# Patient Record
Sex: Female | Born: 1985 | Race: Black or African American | Hispanic: No | Marital: Single | Smoking: Never smoker
Health system: Southern US, Community
[De-identification: ages and names within clinical notes are randomized; demographics above are authoritative.]

## PROBLEM LIST (undated history)

## (undated) DIAGNOSIS — F410 Panic disorder [episodic paroxysmal anxiety] without agoraphobia: Secondary | ICD-10-CM

## (undated) DIAGNOSIS — R51 Headache: Secondary | ICD-10-CM

## (undated) DIAGNOSIS — R519 Headache, unspecified: Secondary | ICD-10-CM

## (undated) DIAGNOSIS — G40B09 Juvenile myoclonic epilepsy, not intractable, without status epilepticus: Secondary | ICD-10-CM

## (undated) DIAGNOSIS — F419 Anxiety disorder, unspecified: Secondary | ICD-10-CM

## (undated) DIAGNOSIS — R569 Unspecified convulsions: Secondary | ICD-10-CM

## (undated) HISTORY — DX: Headache, unspecified: R51.9

## (undated) HISTORY — DX: Panic disorder (episodic paroxysmal anxiety): F41.0

## (undated) HISTORY — PX: TUBAL LIGATION: SHX77

## (undated) HISTORY — PX: LEG SURGERY: SHX1003

## (undated) HISTORY — DX: Juvenile myoclonic epilepsy, not intractable, without status epilepticus: G40.B09

## (undated) HISTORY — DX: Anxiety disorder, unspecified: F41.9

## (undated) HISTORY — DX: Headache: R51

---
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
## 2007-01-11 ENCOUNTER — Emergency Department (HOSPITAL_COMMUNITY): Admission: EM | Admit: 2007-01-11 | Discharge: 2007-01-11 | Payer: Self-pay | Admitting: Emergency Medicine

## 2007-02-03 ENCOUNTER — Emergency Department (HOSPITAL_COMMUNITY): Admission: EM | Admit: 2007-02-03 | Discharge: 2007-02-03 | Payer: Self-pay | Admitting: Emergency Medicine

## 2007-02-13 ENCOUNTER — Emergency Department (HOSPITAL_COMMUNITY): Admission: EM | Admit: 2007-02-13 | Discharge: 2007-02-13 | Payer: Self-pay | Admitting: Emergency Medicine

## 2007-03-24 ENCOUNTER — Inpatient Hospital Stay (HOSPITAL_COMMUNITY): Admission: AD | Admit: 2007-03-24 | Discharge: 2007-03-25 | Payer: Self-pay | Admitting: Obstetrics & Gynecology

## 2007-03-24 ENCOUNTER — Emergency Department (HOSPITAL_COMMUNITY): Admission: EM | Admit: 2007-03-24 | Discharge: 2007-03-24 | Payer: Self-pay | Admitting: Emergency Medicine

## 2007-03-31 ENCOUNTER — Emergency Department (HOSPITAL_COMMUNITY): Admission: EM | Admit: 2007-03-31 | Discharge: 2007-03-31 | Payer: Self-pay | Admitting: Emergency Medicine

## 2008-01-15 ENCOUNTER — Emergency Department (HOSPITAL_COMMUNITY): Admission: EM | Admit: 2008-01-15 | Discharge: 2008-01-16 | Payer: Self-pay | Admitting: Emergency Medicine

## 2008-01-21 ENCOUNTER — Emergency Department (HOSPITAL_COMMUNITY): Admission: EM | Admit: 2008-01-21 | Discharge: 2008-01-21 | Payer: Self-pay | Admitting: Emergency Medicine

## 2008-02-29 ENCOUNTER — Inpatient Hospital Stay (HOSPITAL_COMMUNITY): Admission: AD | Admit: 2008-02-29 | Discharge: 2008-02-29 | Payer: Self-pay | Admitting: Family Medicine

## 2008-04-21 ENCOUNTER — Emergency Department (HOSPITAL_COMMUNITY): Admission: EM | Admit: 2008-04-21 | Discharge: 2008-04-22 | Payer: Self-pay | Admitting: Emergency Medicine

## 2008-04-29 ENCOUNTER — Emergency Department (HOSPITAL_COMMUNITY): Admission: EM | Admit: 2008-04-29 | Discharge: 2008-04-30 | Payer: Self-pay | Admitting: Emergency Medicine

## 2008-06-29 ENCOUNTER — Inpatient Hospital Stay (HOSPITAL_COMMUNITY): Admission: AD | Admit: 2008-06-29 | Discharge: 2008-06-29 | Payer: Self-pay | Admitting: Obstetrics

## 2008-07-13 ENCOUNTER — Emergency Department (HOSPITAL_COMMUNITY): Admission: EM | Admit: 2008-07-13 | Discharge: 2008-07-14 | Payer: Self-pay | Admitting: Emergency Medicine

## 2008-07-19 ENCOUNTER — Inpatient Hospital Stay (HOSPITAL_COMMUNITY): Admission: AD | Admit: 2008-07-19 | Discharge: 2008-07-19 | Payer: Self-pay | Admitting: Obstetrics

## 2008-07-29 ENCOUNTER — Inpatient Hospital Stay (HOSPITAL_COMMUNITY): Admission: AD | Admit: 2008-07-29 | Discharge: 2008-07-29 | Payer: Self-pay | Admitting: Obstetrics

## 2008-08-03 ENCOUNTER — Emergency Department (HOSPITAL_COMMUNITY): Admission: EM | Admit: 2008-08-03 | Discharge: 2008-08-03 | Payer: Self-pay | Admitting: Emergency Medicine

## 2008-08-22 ENCOUNTER — Inpatient Hospital Stay (HOSPITAL_COMMUNITY): Admission: AD | Admit: 2008-08-22 | Discharge: 2008-08-22 | Payer: Self-pay | Admitting: Obstetrics

## 2008-08-22 ENCOUNTER — Ambulatory Visit: Payer: Self-pay | Admitting: Physician Assistant

## 2008-09-01 ENCOUNTER — Ambulatory Visit: Payer: Self-pay | Admitting: Advanced Practice Midwife

## 2008-09-01 ENCOUNTER — Inpatient Hospital Stay (HOSPITAL_COMMUNITY): Admission: AD | Admit: 2008-09-01 | Discharge: 2008-09-02 | Payer: Self-pay | Admitting: Obstetrics

## 2008-10-10 ENCOUNTER — Inpatient Hospital Stay (HOSPITAL_COMMUNITY): Admission: AD | Admit: 2008-10-10 | Discharge: 2008-10-10 | Payer: Self-pay | Admitting: Obstetrics

## 2008-10-20 ENCOUNTER — Inpatient Hospital Stay (HOSPITAL_COMMUNITY): Admission: RE | Admit: 2008-10-20 | Discharge: 2008-10-22 | Payer: Self-pay | Admitting: Obstetrics

## 2008-11-03 ENCOUNTER — Emergency Department (HOSPITAL_COMMUNITY): Admission: EM | Admit: 2008-11-03 | Discharge: 2008-11-04 | Payer: Self-pay | Admitting: Emergency Medicine

## 2008-11-17 ENCOUNTER — Emergency Department (HOSPITAL_COMMUNITY): Admission: EM | Admit: 2008-11-17 | Discharge: 2008-11-17 | Payer: Self-pay | Admitting: Emergency Medicine

## 2009-01-18 ENCOUNTER — Emergency Department (HOSPITAL_COMMUNITY): Admission: EM | Admit: 2009-01-18 | Discharge: 2009-01-18 | Payer: Self-pay | Admitting: Family Medicine

## 2009-07-04 ENCOUNTER — Emergency Department (HOSPITAL_COMMUNITY): Admission: EM | Admit: 2009-07-04 | Discharge: 2009-07-04 | Payer: Self-pay | Admitting: Emergency Medicine

## 2009-09-30 ENCOUNTER — Emergency Department (HOSPITAL_COMMUNITY): Admission: EM | Admit: 2009-09-30 | Discharge: 2009-09-30 | Payer: Self-pay | Admitting: Emergency Medicine

## 2009-11-05 ENCOUNTER — Emergency Department (HOSPITAL_COMMUNITY): Admission: EM | Admit: 2009-11-05 | Discharge: 2009-11-06 | Payer: Self-pay | Admitting: Emergency Medicine

## 2009-12-08 ENCOUNTER — Ambulatory Visit (HOSPITAL_COMMUNITY): Admission: RE | Admit: 2009-12-08 | Discharge: 2009-12-08 | Payer: Self-pay | Admitting: Obstetrics

## 2010-04-17 ENCOUNTER — Inpatient Hospital Stay (HOSPITAL_COMMUNITY)
Admission: AD | Admit: 2010-04-17 | Discharge: 2010-04-17 | Payer: Self-pay | Source: Home / Self Care | Admitting: Obstetrics

## 2010-05-31 ENCOUNTER — Ambulatory Visit (HOSPITAL_COMMUNITY)
Admission: RE | Admit: 2010-05-31 | Discharge: 2010-05-31 | Payer: Self-pay | Source: Home / Self Care | Attending: Obstetrics | Admitting: Obstetrics

## 2010-06-04 LAB — LSPG (L/S RATIO WITH PG)-AMNIO FLUID

## 2010-06-12 ENCOUNTER — Inpatient Hospital Stay (HOSPITAL_COMMUNITY)
Admission: AD | Admit: 2010-06-12 | Discharge: 2010-06-15 | Payer: Self-pay | Source: Home / Self Care | Attending: Obstetrics | Admitting: Obstetrics

## 2010-06-13 LAB — CBC
HCT: 30.9 % — ABNORMAL LOW (ref 36.0–46.0)
Hemoglobin: 11.2 g/dL — ABNORMAL LOW (ref 12.0–15.0)
MCH: 32.4 pg (ref 26.0–34.0)
MCHC: 36.2 g/dL — ABNORMAL HIGH (ref 30.0–36.0)
MCV: 89.3 fL (ref 78.0–100.0)
Platelets: 302 10*3/uL (ref 150–400)
RBC: 3.46 MIL/uL — ABNORMAL LOW (ref 3.87–5.11)
RDW: 13.8 % (ref 11.5–15.5)
WBC: 8.7 10*3/uL (ref 4.0–10.5)

## 2010-06-13 LAB — RPR: RPR Ser Ql: NONREACTIVE

## 2010-06-13 LAB — MRSA PCR SCREENING: MRSA by PCR: NEGATIVE

## 2010-06-14 LAB — CBC
HCT: 24.5 % — ABNORMAL LOW (ref 36.0–46.0)
Hemoglobin: 8.7 g/dL — ABNORMAL LOW (ref 12.0–15.0)
MCH: 32.1 pg (ref 26.0–34.0)
MCHC: 35.5 g/dL (ref 30.0–36.0)
MCV: 90.4 fL (ref 78.0–100.0)
Platelets: 272 10*3/uL (ref 150–400)
RBC: 2.71 MIL/uL — ABNORMAL LOW (ref 3.87–5.11)
RDW: 14.3 % (ref 11.5–15.5)
WBC: 13.6 10*3/uL — ABNORMAL HIGH (ref 4.0–10.5)

## 2010-06-20 NOTE — Op Note (Addendum)
  [name], Barbara Hodges                ACCOUNT NO.:  [id number]  MEDICAL RECORD NO.:  [id number]          PATIENT TYPE:  INP  LOCATION:  9372                          FACILITY:  [facility]  PHYSICIAN:  Kathreen Cosier, M.D.DATE OF BIRTH:  June 06, 1985  DATE OF PROCEDURE:  06/13/2010 DATE OF DISCHARGE:                              OPERATIVE REPORT   PREOPERATIVE DIAGNOSIS:  Multiparity, desires sterilization.  POSTOPERATIVE DIAGNOSIS:  Multiparity, desires sterilization.  SURGEON:  Kathreen Cosier, MD  ANESTHESIA:  Epidural.  PROCEDURE IN DETAILS:  The patient was placed in the operating room table in supine position.  Abdomen prepped and draped.  Bladder emptied with Foley catheter.  A midline subumbilical incision 1-inch long was made, carried down to the fascia, fascia cleaned, grasped with 2 Kochers in the fascia and the peritoneum opened with the Mayo scissors.  Left tube grasped in the midportion with a Babcock clamp, 0 plain suture placed in the mesosalpinx below the portion of tube within the clamp and this was tied with 0 plain suture and approximately 1 inch of tube transected.  The tube had previously been traced to the fimbria. Procedure done in an exact fashion on the other side.  Lap and sponge counts correct.  Abdomen closed in layers.  Peritoneum and fascia closed with continuous suture of 0 Vicryl.  The skin closed with continuous suture of 4-0 Monocryl.  The patient tolerated the procedure well, taken to the operating room in good condition.          ______________________________ Kathreen Cosier, M.D.     BAM/MEDQ  D:  06/13/2010  T:  06/14/2010  Job:  [id number]  Electronically Signed by Francoise Ceo M.D. on 06/20/2010 08:15:16 AM

## 2010-07-31 LAB — URINALYSIS, ROUTINE W REFLEX MICROSCOPIC
Bilirubin Urine: NEGATIVE
Glucose, UA: NEGATIVE mg/dL
Ketones, ur: NEGATIVE mg/dL
Nitrite: NEGATIVE
pH: 7.5 (ref 5.0–8.0)

## 2010-08-07 LAB — POCT I-STAT, CHEM 8
Calcium, Ion: 1.21 mmol/L (ref 1.12–1.32)
Glucose, Bld: 87 mg/dL (ref 70–99)
HCT: 40 % (ref 36.0–46.0)
Hemoglobin: 13.6 g/dL (ref 12.0–15.0)

## 2010-08-07 LAB — URINALYSIS, ROUTINE W REFLEX MICROSCOPIC
Bilirubin Urine: NEGATIVE
Hgb urine dipstick: NEGATIVE
Specific Gravity, Urine: 1.018 (ref 1.005–1.030)
pH: 6.5 (ref 5.0–8.0)

## 2010-08-07 LAB — GC/CHLAMYDIA PROBE AMP, GENITAL: GC Probe Amp, Genital: NEGATIVE

## 2010-08-07 LAB — WET PREP, GENITAL
Clue Cells Wet Prep HPF POC: NONE SEEN
Trich, Wet Prep: NONE SEEN
Yeast Wet Prep HPF POC: NONE SEEN

## 2010-08-08 LAB — BASIC METABOLIC PANEL
BUN: 11 mg/dL (ref 6–23)
Chloride: 109 mEq/L (ref 96–112)
Creatinine, Ser: 1.03 mg/dL (ref 0.4–1.2)
GFR calc non Af Amer: >60 mL/min (ref >60)

## 2010-08-08 LAB — PREGNANCY, URINE: Preg Test, Ur: NEGATIVE

## 2010-08-18 ENCOUNTER — Emergency Department (HOSPITAL_COMMUNITY)
Admission: EM | Admit: 2010-08-18 | Discharge: 2010-08-19 | Disposition: A | Discharge status: Home / Self Care | Payer: Medicaid Other | Attending: Emergency Medicine | Admitting: Emergency Medicine

## 2010-08-18 ENCOUNTER — Emergency Department (HOSPITAL_COMMUNITY): Payer: Medicaid Other

## 2010-08-18 DIAGNOSIS — K089 Disorder of teeth and supporting structures, unspecified: Secondary | ICD-10-CM | POA: Insufficient documentation

## 2010-08-18 DIAGNOSIS — S025XXA Fracture of tooth (traumatic), initial encounter for closed fracture: Secondary | ICD-10-CM | POA: Insufficient documentation

## 2010-08-18 DIAGNOSIS — Z79899 Other long term (current) drug therapy: Secondary | ICD-10-CM | POA: Insufficient documentation

## 2010-08-18 DIAGNOSIS — G40909 Epilepsy, unspecified, not intractable, without status epilepticus: Secondary | ICD-10-CM | POA: Insufficient documentation

## 2010-08-18 DIAGNOSIS — S01501A Unspecified open wound of lip, initial encounter: Secondary | ICD-10-CM | POA: Insufficient documentation

## 2010-08-18 DIAGNOSIS — R296 Repeated falls: Secondary | ICD-10-CM | POA: Insufficient documentation

## 2010-08-18 DIAGNOSIS — Y92009 Unspecified place in unspecified non-institutional (private) residence as the place of occurrence of the external cause: Secondary | ICD-10-CM | POA: Insufficient documentation

## 2010-08-18 DIAGNOSIS — R51 Headache: Secondary | ICD-10-CM | POA: Insufficient documentation

## 2010-08-26 LAB — POCT URINALYSIS DIP (DEVICE)
Protein, ur: NEGATIVE mg/dL
Specific Gravity, Urine: 1.015 (ref 1.005–1.030)
Urobilinogen, UA: 2 mg/dL — ABNORMAL HIGH (ref 0.0–1.0)

## 2010-08-26 LAB — WET PREP, GENITAL: Trich, Wet Prep: NONE SEEN

## 2010-08-26 LAB — POCT PREGNANCY, URINE: Preg Test, Ur: NEGATIVE

## 2010-08-27 LAB — CBC
HCT: 31.7 % — ABNORMAL LOW (ref 36.0–46.0)
Hemoglobin: 10 g/dL — ABNORMAL LOW (ref 12.0–15.0)
Platelets: 291 10*3/uL (ref 150–400)
RBC: 3.04 MIL/uL — ABNORMAL LOW (ref 3.87–5.11)
WBC: 11 10*3/uL — ABNORMAL HIGH (ref 4.0–10.5)
WBC: 7 10*3/uL (ref 4.0–10.5)

## 2010-08-27 LAB — URINALYSIS, ROUTINE W REFLEX MICROSCOPIC
Glucose, UA: NEGATIVE mg/dL
Nitrite: NEGATIVE
Protein, ur: NEGATIVE mg/dL
Urobilinogen, UA: 1 mg/dL (ref 0.0–1.0)

## 2010-08-27 LAB — RPR: RPR Ser Ql: NONREACTIVE

## 2010-08-29 LAB — URINALYSIS, ROUTINE W REFLEX MICROSCOPIC
Glucose, UA: NEGATIVE mg/dL
Hgb urine dipstick: NEGATIVE
Specific Gravity, Urine: 1.025 (ref 1.005–1.030)
pH: 7 (ref 5.0–8.0)

## 2010-08-29 LAB — RAPID URINE DRUG SCREEN, HOSP PERFORMED
Amphetamines: NOT DETECTED
Barbiturates: NOT DETECTED
Benzodiazepines: NOT DETECTED
Opiates: NOT DETECTED

## 2010-08-30 LAB — ABO/RH: ABO/RH(D): O POS

## 2010-08-30 LAB — DIFFERENTIAL
Basophils Absolute: 0 10*3/uL (ref 0.0–0.1)
Lymphocytes Relative: 9 % — ABNORMAL LOW (ref 12–46)
Monocytes Absolute: 0.6 10*3/uL (ref 0.1–1.0)
Monocytes Relative: 5 % (ref 3–12)
Neutro Abs: 10 10*3/uL — ABNORMAL HIGH (ref 1.7–7.7)

## 2010-08-30 LAB — CBC
Hemoglobin: 10.5 g/dL — ABNORMAL LOW (ref 12.0–15.0)
MCHC: 33.4 g/dL (ref 30.0–36.0)
MCV: 98.3 fL (ref 78.0–100.0)
RDW: 14.4 % (ref 11.5–15.5)

## 2010-08-30 LAB — COMPREHENSIVE METABOLIC PANEL
Albumin: 3 g/dL — ABNORMAL LOW (ref 3.5–5.2)
BUN: 4 mg/dL — ABNORMAL LOW (ref 6–23)
Creatinine, Ser: 0.52 mg/dL (ref 0.4–1.2)
Total Bilirubin: 0.6 mg/dL (ref 0.3–1.2)
Total Protein: 5.8 g/dL — ABNORMAL LOW (ref 6.0–8.3)

## 2010-08-30 LAB — PROTIME-INR: INR: 1.1 (ref 0.00–1.49)

## 2010-08-30 LAB — APTT: aPTT: 30 seconds (ref 24–37)

## 2010-09-04 LAB — URINE MICROSCOPIC-ADD ON

## 2010-09-04 LAB — CBC
MCHC: 33 g/dL (ref 30.0–36.0)
Platelets: 289 10*3/uL (ref 150–400)
RBC: 3.41 MIL/uL — ABNORMAL LOW (ref 3.87–5.11)
RDW: 15.2 % (ref 11.5–15.5)

## 2010-09-04 LAB — COMPREHENSIVE METABOLIC PANEL
ALT: 14 U/L (ref 0–35)
AST: 16 U/L (ref 0–37)
Albumin: 2.9 g/dL — ABNORMAL LOW (ref 3.5–5.2)
CO2: 24 mEq/L (ref 19–32)
Calcium: 8.8 mg/dL (ref 8.4–10.5)
GFR calc Af Amer: >60 mL/min (ref >60)
Sodium: 135 mEq/L (ref 135–145)
Total Protein: 6.3 g/dL (ref 6.0–8.3)

## 2010-09-04 LAB — URINALYSIS, ROUTINE W REFLEX MICROSCOPIC
Bilirubin Urine: NEGATIVE
Glucose, UA: NEGATIVE mg/dL
Hgb urine dipstick: NEGATIVE
Ketones, ur: NEGATIVE mg/dL
Specific Gravity, Urine: 1.015 (ref 1.005–1.030)
pH: 7 (ref 5.0–8.0)

## 2010-10-02 NOTE — Discharge Summary (Signed)
Barbara Hodges, Barbara Hodges                ACCOUNT NO.:  [id number]   MEDICAL RECORD NO.:  [id number]          PATIENT TYPE:  INP   LOCATION:  9131                          [facility]ILITY:  [facility]   PHYSICIAN:  Tanya S. Shawnie Pons, M.D.   DATE OF BIRTH:  [birth year]   DATE OF ADMISSION:  12/19/2006  DATE OF DISCHARGE:  12/22/2006                               DISCHARGE SUMMARY   ADMISSION DIAGNOSIS:  Labor with term pregnancy.   DISCHARGE DIAGNOSIS:  Status post spontaneous vaginal delivery  complicated by postpartum hemorrhage, now stabilized.   HOSPITAL COURSE:  Patient is an 25 year old G2, P2-0-0-2 admitted on  December 19, 2006, at 40 weeks and 6 days gestation with the onset of  labor.  Patient delivered via a spontaneous vaginal delivery, a healthy  viable female on December 20, 2006.  Immediately postpartum, the patient's  course was complicated by a postpartum hemorrhage.  Patient received  Hemabate intramuscularly one time and bleeding subsequently resolved.  Postpartum, the patient did well , did not have any symptoms of anemia,  however, on December 21, 2006, her hemoglobin was noted to be 6.9 and on  December 22, 2006, hemoglobin was 6.5.  Patient, throughout her course,  remained asymptomatic with no feelings of dizziness or lightheadedness.  On December 22, 2006, postpartum day #2, patient was deemed fit to return  home with close follow-up at [facility] Department.  It  should be noted that during the patient's admission here, her magnesium  was noted to be low for two days, being 2.5 on the day of discharge.  Patient will also be discharged with potassium and iron to take at home  until her follow-up at [facility] Department in four days.  At the time of discharge, mother and baby are doing well.  A viable female  infant, initially had Apgars of 8 at one minute and 9 at five minutes.  Patient is breast and bottle-feeding her baby.  Regarding birth control,  the patient is going to  abstain from sex for six weeks and then start  taking birth control pills at six weeks.   Of note, the patient's prenatal labs were significant for blood type of  O positive, antibody screen negative, RPR was nonreactive.  Patient was  rubella immune.  HIV was nonreactive.  Patient's GBS status was  negative.   PENDING RESULTS/ISSUES TO BE FOLLOWED:  Patient should have a potassium  level and a hemoglobin and hematocrit drawn at her follow-up visit in 3-  4 days at [facility] Department.   MEDICATIONS:  The patient is to take  1. Motrin 600 mg p.o. every six hours p.r.n. pain.  2. Ferrous sulfate 325 mg orally twice daily.  3. Colace 100 mg p.o. twice daily p.r.n. constipation.  4. Prenatal vitamins once daily while breast-feeding.  5. K-Dur 40 mEq p.o. twice daily for the next four days until follow-      up at [facility] Department.   DISCHARGE INSTRUCTIONS:  1. Patient is to maintain pelvic rest for six weeks with nothing in  the vagina over this time.  2. The patient is to follow up in three days at [facility]      Department.  3. Patient is to take previously mentioned Motrin, prenatal vitamins,      Colace, potassium and iron at home.  4. Patient is to undertake no heavy lifting for at least six weeks.  5. The patient is to make sure she follows up with the [facility] Department on Wednesday, December 21, 2006, at 3 p.m.      Myrtie Soman, MD      Shelbie Proctor. Shawnie Pons, M.D.  Electronically Signed    TE/MEDQ  D:  12/22/2006  T:  12/22/2006  Job:  [id number]

## 2010-10-05 NOTE — Consult Note (Signed)
Barbara Hodges, Barbara Hodges                ACCOUNT NO.:  [id number]   MEDICAL RECORD NO.:  [id number]          PATIENT TYPE:  MAT   LOCATION:  MATC                          [facility]ILITY:  [facility]   PHYSICIAN:  Catherine A. Orlin Hilding, M.D.DATE OF BIRTH:  Oct 27, 1985   DATE OF CONSULTATION:  09/11/2006  DATE OF DISCHARGE:                                 CONSULTATION   CHIEF COMPLAINT:  Seizure.   HISTORY OF PRESENT ILLNESS:  The patient is an 25 year old, right-  handed, G2, P1 woman, with no significant past medical history, with a  family history of seizures.  She had a sister who has had seizures since  childhood and was treated during childhood.  The patient does admit to  some stress, and does sometimes get tremulous and anxious with stress;  however, she has never had a seizure before.  Last night, she was  arguing with her 80 year old boyfriend, who is the father of this child.  Shortly after that, she went to bed.  She says he had his back to her,  he heard a sound and turned around to find her and generalized  convulsive activity which lasted for about 2 minutes.  She was  unresponsive.  He described that her eyes were rolled back and distant  and she was drooling.  He turned her on her side and began to pat her  back when she was coughing.  She had approximately a 10-second apneic  spell.  He did call 911, and the whole episode lasted about 2 minutes.  She was confused on coming around when EMS arrived.  She was still  acting groggy, but otherwise still at baseline.  She does not remember  anything about the incident itself, except coming to in the ambulance  and having them ask her questions.   REVIEW OF SYSTEMS:  Review of systems is negative for any visual  problems, nausea, vomiting, headache, chest pain, shortness of breath.  She did like her tongue.  There is no incontinence of bowel or bladder.  Jetsam transient abdominal cramps.   PAST MEDICAL HISTORY:  Significant for delivery of a  viable child 2-1/2  years ago.  She had some proteinuria during this pregnancy.  No history  of head injury.   MEDICATIONS:  Prenatal vitamins only.  She was previously getting Depo  shots and oral contraceptives in the past.   ALLERGIES:  NO KNOWN DRUG ALLERGIES.   SOCIAL HISTORY:  She is single.  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  She says she lives alone or with her boyfriend.  No  smoking or alcohol.  She denies any drug use, including cocaine,  methamphetamine, or ecstasy.   FAMILY HISTORY:  She has a sister who is 22, who had a history of  epilepsy since childhood, but is now not on medication and has not had  any recent seizures.   OBJECTIVE:  VITAL SIGNS:  Temperature is 97.6, pulse 77, respirations  18, BP 114/52, 100% saturation on room air.  HEENT:  Head is normocephalic, atraumatic, except for a tongue  laceration of the right lateral tongue.  NECK:  Neck is supple, with no bruits.  HEART:  Regular rate and rhythm.  LUNGS:  Clear to auscultation.  ABDOMEN:  Abdomen is benign.  NEUROLOGIC EXAM:  She is still somewhat groggy but oriented to hospital.  She knows which hospital and knows what she is here, although she does  not recall the event itself.  CRANIAL NERVES:  Pupilse are equal and reactive.  Extraocular movements  are intact.  Visual fields are full.  Facial sensation is normal.  Facial motor exam is normal, but she does not give me much effort.  Hearing is in fact.  Palate is symmetric.  Tongue is midline.  On the  motor exam, she has normal bulk, tone, and strength throughout.  No  drift or satellite abnormal fine movements.  No fasciculations, atrophy,  or tremor.  Reflexes at 1+ to 2+ and symmetric.  She has downgoing toes  to plantar stimulation.  On coordination, finger-to-nose and heel-to-  shin are intact.  Sensory exam is intact.   LABORATORY DATA:  Laboratories include a potassium of 2.9, calcium is  8.5, hemoglobin 10.6,  albumin is 2.8.  LFTs are normal.  LVH is normal.   ASSESSMENT:  An 25 year old woman who is G2, P1, now 30 weeks' pregnant  with a healthy pregnancy with what is a convincing description of a  seizure with generalized tonic clonic activity and tongue biting.  There  is a family history of what sounds like primary generalized epilepsy.  This is a single event, however.   RECOMMENDATIONS:  I do not think she needs to be admitted for this.  She  does need appropriate rapid outpatient workup.  We have her scheduled  tomorrow for an EEG at 9 and MRI at 10, and follow up in my office early  next week.  She was instructed not to drive.  If the EEG and MRI are  negative, with a single event, I would not treat this; however, if there  is epileptiform activity seen on the EEG, will need to consider  treatment, possibly with Lamictal.  Will discuss that with her at the  time we see her and follow her for her testing.      Catherine A. Orlin Hilding, M.D.  Electronically Signed     CAW/MEDQ  D:  09/11/2006  T:  09/11/2006  Job:  [id number]   cc:   Javier Glazier. Okey Dupre, M.D.

## 2010-10-05 NOTE — Procedures (Signed)
EEG NUMBER:  [phone]   HISTORY OF PRESENT ILLNESS:  This is an 25 year old who is currently [redacted]  weeks pregnant who had possible seizure activity.  Patient having EEG  done to evaluate for seizure activity.   PROCEDURE:  This is a routine EEG.   TECHNICAL DESCRIPTION:  Throughout this routine EEG, there is a  posterior dominant rhythm of 9-1/2 Hz activity at 30-40 microvolts.  The  Background activity is symmetric, mostly comprised of alpha range  activity at 20-45 microvolts.  With photic stimulation, there is a mild  symmetric photic driving response.  During hyperventilation, there is  abnormality in the background with the 3-4 Hz frontally predominant  generalized spike and wave discharges which seems skewed to the right  with right frontal spike wave discharges predominant at around 100  microvolts.  There is also another isolated frontally predominant  generalized spike and wave discharge independent of hyperventilation  that occurs later in the tracing.  The patient did not go to sleep  during this recording.   IMPRESSION:  This routine EEG is abnormal secondary to generalized spike  wave discharge which at times seem skewed to the right with right  frontal spike and wave discharges.  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  These findings were discussed with ordering physician, Dr. Marcelino Freestone.  Correlation is advised.      Bevelyn Buckles. Nash Shearer, M.D.  Electronically Signed     WJX:BJYN  D:  09/12/2006 11:20:55  T:  09/12/2006 11:51:50  Job #:  [id number]

## 2011-02-18 LAB — URINALYSIS, ROUTINE W REFLEX MICROSCOPIC
Nitrite: NEGATIVE
Specific Gravity, Urine: 1.02
Urobilinogen, UA: 0.2
pH: 8

## 2011-02-18 LAB — CBC
HCT: 35.7 — ABNORMAL LOW
Hemoglobin: 11.8 — ABNORMAL LOW
MCHC: 33.2
RBC: 3.86 — ABNORMAL LOW
RDW: 14.5

## 2011-02-18 LAB — GC/CHLAMYDIA PROBE AMP, GENITAL
Chlamydia, DNA Probe: NEGATIVE
GC Probe Amp, Genital: NEGATIVE

## 2011-02-18 LAB — ABO/RH: ABO/RH(D): O POS

## 2011-02-18 LAB — WET PREP, GENITAL
Trich, Wet Prep: NONE SEEN
Yeast Wet Prep HPF POC: NONE SEEN

## 2011-02-21 LAB — CBC
HCT: 33.3 % — ABNORMAL LOW (ref 36.0–46.0)
Hemoglobin: 11.1 g/dL — ABNORMAL LOW (ref 12.0–15.0)
MCV: 93.5 fL (ref 78.0–100.0)
Platelets: 283 10*3/uL (ref 150–400)
WBC: 6.2 10*3/uL (ref 4.0–10.5)

## 2011-02-21 LAB — COMPREHENSIVE METABOLIC PANEL
Alkaline Phosphatase: 37 U/L — ABNORMAL LOW (ref 39–117)
BUN: 5 mg/dL — ABNORMAL LOW (ref 6–23)
Chloride: 106 mEq/L (ref 96–112)
Creatinine, Ser: 0.61 mg/dL (ref 0.4–1.2)
GFR calc non Af Amer: >60 mL/min (ref >60)
Glucose, Bld: 76 mg/dL (ref 70–99)
Potassium: 3.6 mEq/L (ref 3.5–5.1)
Total Bilirubin: 0.5 mg/dL (ref 0.3–1.2)

## 2011-02-21 LAB — URINALYSIS, ROUTINE W REFLEX MICROSCOPIC
Bilirubin Urine: NEGATIVE
Glucose, UA: NEGATIVE mg/dL
Hgb urine dipstick: NEGATIVE
Ketones, ur: NEGATIVE mg/dL
Nitrite: NEGATIVE
Specific Gravity, Urine: 1.022 (ref 1.005–1.030)
pH: 7 (ref 5.0–8.0)

## 2011-02-21 LAB — DIFFERENTIAL
Basophils Absolute: 0 10*3/uL (ref 0.0–0.1)
Basophils Relative: 0 % (ref 0–1)
Lymphocytes Relative: 25 % (ref 12–46)
Monocytes Absolute: 0.4 10*3/uL (ref 0.1–1.0)
Neutro Abs: 4.1 10*3/uL (ref 1.7–7.7)
Neutrophils Relative %: 66 % (ref 43–77)

## 2011-02-21 LAB — PROTIME-INR
INR: 1 (ref 0.00–1.49)
Prothrombin Time: 13 seconds (ref 11.6–15.2)

## 2011-02-21 LAB — LIPASE, BLOOD: Lipase: 20 U/L (ref 11–59)

## 2011-02-21 LAB — MAGNESIUM: Magnesium: 2 mg/dL (ref 1.5–2.5)

## 2011-02-21 LAB — TYPE AND SCREEN: Antibody Screen: NEGATIVE

## 2011-02-26 LAB — DIFFERENTIAL
Basophils Absolute: 0
Basophils Relative: 1
Eosinophils Absolute: 0.2
Eosinophils Relative: 4
Lymphocytes Relative: 47 — ABNORMAL HIGH
Lymphs Abs: 2.6
Monocytes Absolute: 0.3
Monocytes Relative: 6
Neutro Abs: 2.4
Neutrophils Relative %: 43

## 2011-02-26 LAB — BASIC METABOLIC PANEL WITH GFR
BUN: 7
CO2: 23
Calcium: 9.7
Creatinine, Ser: 0.72
GFR calc non Af Amer: >60
Glucose, Bld: 81
Sodium: 140

## 2011-02-26 LAB — CBC
HCT: 31.2 — ABNORMAL LOW
HCT: 34.1 — ABNORMAL LOW
Hemoglobin: 10.4 — ABNORMAL LOW
Hemoglobin: 11.5 — ABNORMAL LOW
MCHC: 33.4
MCHC: 33.6
MCV: 82.8
Platelets: 373
Platelets: 459 — ABNORMAL HIGH
RBC: 4.12
RDW: 19.1 — ABNORMAL HIGH
RDW: 19.3 — ABNORMAL HIGH
WBC: 5.6

## 2011-02-26 LAB — URINE MICROSCOPIC-ADD ON

## 2011-02-26 LAB — URINALYSIS, ROUTINE W REFLEX MICROSCOPIC
Specific Gravity, Urine: 1.01
Urobilinogen, UA: 0.2
pH: 6

## 2011-02-26 LAB — BASIC METABOLIC PANEL
Chloride: 107
GFR calc Af Amer: >60
Potassium: 3.8

## 2011-02-26 LAB — POCT PREGNANCY, URINE
Operator id: 26403
Preg Test, Ur: NEGATIVE
Preg Test, Ur: NEGATIVE

## 2011-02-26 LAB — WET PREP, GENITAL

## 2011-02-28 LAB — POCT RAPID STREP A: Streptococcus, Group A Screen (Direct): NEGATIVE

## 2011-02-28 LAB — I-STAT 8, (EC8 V) (CONVERTED LAB)
BUN: 10
Bicarbonate: 26.3 — ABNORMAL HIGH
Chloride: 105
Hemoglobin: 12.2
Operator id: 235561
Sodium: 138

## 2011-03-01 LAB — URINALYSIS, ROUTINE W REFLEX MICROSCOPIC
Bilirubin Urine: NEGATIVE
Hgb urine dipstick: NEGATIVE
Specific Gravity, Urine: 1.02
Urobilinogen, UA: 0.2

## 2011-03-01 LAB — BASIC METABOLIC PANEL
Calcium: 9.7
Chloride: 103
Creatinine, Ser: 0.74
GFR calc Af Amer: >60
GFR calc non Af Amer: >60

## 2011-03-01 LAB — CBC
RBC: 3.61 — ABNORMAL LOW
WBC: 4.5

## 2011-03-01 LAB — URINE MICROSCOPIC-ADD ON

## 2011-03-01 LAB — DIFFERENTIAL
Lymphs Abs: 1.5
Monocytes Relative: 9
Neutro Abs: 2.2
Neutrophils Relative %: 48

## 2011-03-04 LAB — COMPREHENSIVE METABOLIC PANEL
Albumin: 2 — ABNORMAL LOW
Albumin: 2.1 — ABNORMAL LOW
Alkaline Phosphatase: 66
BUN: 2 — ABNORMAL LOW
BUN: 4 — ABNORMAL LOW
CO2: 25
Chloride: 108
Creatinine, Ser: 0.81
Creatinine, Ser: 0.85
GFR calc Af Amer: >60
GFR calc non Af Amer: >60
Glucose, Bld: 90
Potassium: 2.5 — CL
Potassium: 2.5 — CL
Total Bilirubin: 0.9
Total Protein: 4.7 — ABNORMAL LOW

## 2011-03-04 LAB — URINALYSIS, ROUTINE W REFLEX MICROSCOPIC
Bilirubin Urine: NEGATIVE
Glucose, UA: NEGATIVE
Hgb urine dipstick: NEGATIVE
Nitrite: NEGATIVE
Protein, ur: NEGATIVE
Specific Gravity, Urine: 1.01
Specific Gravity, Urine: 1.01
Urobilinogen, UA: 0.2

## 2011-03-04 LAB — CBC
HCT: 19.4 — ABNORMAL LOW
HCT: 20.3 — ABNORMAL LOW
HCT: 24.1 — ABNORMAL LOW
HCT: 29.5 — ABNORMAL LOW
Hemoglobin: 6.7 — CL
Hemoglobin: 8.1 — ABNORMAL LOW
MCHC: 33.7
MCHC: 34
MCHC: 34.3
MCV: 91.4
MCV: 91.6
MCV: 91.8
MCV: 91.8
MCV: 92.3
Platelets: 223
Platelets: 240
Platelets: 277
RBC: 2.17 — ABNORMAL LOW
RBC: 3.23 — ABNORMAL LOW
RDW: 15.5 — ABNORMAL HIGH
WBC: 11.2 — ABNORMAL HIGH
WBC: 11.6 — ABNORMAL HIGH

## 2011-03-04 LAB — CROSSMATCH: ABO/RH(D): O POS

## 2011-03-04 LAB — URINE CULTURE
Colony Count: NO GROWTH
Culture: NO GROWTH

## 2011-03-04 LAB — WET PREP, GENITAL
Clue Cells Wet Prep HPF POC: NONE SEEN
Trich, Wet Prep: NONE SEEN
Yeast Wet Prep HPF POC: NONE SEEN

## 2011-03-04 LAB — URINE MICROSCOPIC-ADD ON

## 2011-03-04 LAB — LACTATE DEHYDROGENASE: LDH: 226

## 2011-03-04 LAB — RPR: RPR Ser Ql: NONREACTIVE

## 2011-03-04 LAB — URIC ACID: Uric Acid, Serum: 6.3

## 2011-03-07 LAB — URINALYSIS, ROUTINE W REFLEX MICROSCOPIC
Ketones, ur: NEGATIVE
Nitrite: NEGATIVE
Protein, ur: NEGATIVE
Urobilinogen, UA: 1

## 2011-05-30 ENCOUNTER — Encounter (HOSPITAL_COMMUNITY): Payer: Self-pay | Admitting: Emergency Medicine

## 2011-05-30 ENCOUNTER — Emergency Department (HOSPITAL_COMMUNITY)
Admission: EM | Admit: 2011-05-30 | Discharge: 2011-05-30 | Disposition: A | Discharge status: Home / Self Care | Payer: Medicaid Other | Attending: Emergency Medicine | Admitting: Emergency Medicine

## 2011-05-30 DIAGNOSIS — R55 Syncope and collapse: Secondary | ICD-10-CM

## 2011-05-30 HISTORY — DX: Unspecified convulsions: R56.9

## 2011-05-30 NOTE — ED Notes (Signed)
[id number]<OV> Expected date:05/30/11<BR> Expected time: 1:16 PM<BR> Means of arrival:Ambulance<BR> Comments:<BR> EMS 61 GC. Possible seizure.

## 2011-05-30 NOTE — ED Notes (Signed)
Per EMS.  Pt was sitting in magistrate office when she slumped over and fell to the ground "in a controlled fashion".  Witness states that she did not have any convulsions or seizing.  Would not open eyes or respond to commands.  Upon EMS arrival she was able to focus on paramedic.  Soon she was able to answer questions.  Complains of dizziness and LOC.  Hx of seizures more than 1x a month.  Has not been taking meds.

## 2011-06-01 NOTE — ED Provider Notes (Signed)
History     CSN: [id number]  Arrival date & time 05/30/11  1318   First MD Initiated Contact with Patient 05/30/11 1333      Chief Complaint  Patient presents with  . Seizures    (Consider location/radiation/quality/duration/timing/severity/associated sxs/prior treatment) HPI.  Patient was apparently mixed-race office when she was "slumped" to the floor. No seizure activity. When the EMS arrived,  Patient is normal.  No neuro deficits, chest pain, shortness of breath.  Has not been taking her home meds. no Postictal behavior.  Nothing makes symptoms better or worse  Past Medical History  Diagnosis Date  . Seizures     History reviewed. No pertinent past surgical history.  History reviewed. No pertinent family history.  History  Substance Use Topics  . Smoking status: Current Everyday Smoker  . Smokeless tobacco: Not on file  . Alcohol Use: Yes    OB History    Grav Para Term Preterm Abortions TAB SAB Ect Mult Living                  Review of Systems  All other systems reviewed and are negative.    Allergies  Food allergy formula  Home Medications   Current Outpatient Rx  Name Route Sig Dispense Refill  . CLONAZEPAM 1 MG PO TABS Oral Take 1 mg by mouth 2 (two) times daily.    . ADULT MULTIVITAMIN W/MINERALS CH Oral Take 1 tablet by mouth daily.    Marland Kitchen ZONISAMIDE 100 MG PO CAPS Oral Take 100 mg by mouth daily.      BP 110/68  Pulse 68  Temp(Src) 98.8 F (37.1 C) (Oral)  Resp 16  SpO2 97%  Physical Exam  Nursing note and vitals reviewed. Constitutional: She is oriented to person, place, and time. She appears well-developed and well-nourished.  HENT:  Head: Normocephalic and atraumatic.  Eyes: Conjunctivae and EOM are normal. Pupils are equal, round, and reactive to light.  Neck: Normal range of motion. Neck supple.  Cardiovascular: Normal rate and regular rhythm.   Pulmonary/Chest: Effort normal and breath sounds normal.  Abdominal: Soft. Bowel  sounds are normal.  Musculoskeletal: Normal range of motion.  Neurological: She is alert and oriented to person, place, and time.  Skin: Skin is warm and dry.  Psychiatric: She has a normal mood and affect.    ED Course  Procedures (including critical care time) Results for orders placed during the hospital encounter of 06/12/10  CBC      Component Value Range   WBC 8.7  4.0 - 10.5 (K/uL)   RBC 3.46 (*) 3.87 - 5.11 (MIL/uL)   Hemoglobin 11.2 (*) 12.0 - 15.0 (g/dL)   HCT 40.9 (*) 81.1 - 46.0 (%)   MCV 89.3  78.0 - 100.0 (fL)   MCH 32.4  26.0 - 34.0 (pg)   MCHC 36.2 (*) 30.0 - 36.0 (g/dL)   RDW 91.4  78.2 - 95.6 (%)   Platelets 302  150 - 400 (K/uL)  RPR      Component Value Range   RPR NON REACTIVE  NON REACTIVE   MRSA PCR SCREENING      Component Value Range   MRSA by PCR    NEGATIVE    Value: NEGATIVE            The GeneXpert MRSA Assay (FDA     approved for NASAL specimens     only), is one component of a     comprehensive MRSA colonization  surveillance program. It is not     intended to diagnose MRSA     infection nor to guide or     monitor treatment for     MRSA infections.  CBC      Component Value Range   WBC 13.6 (*) 4.0 - 10.5 (K/uL)   RBC 2.71 (*) 3.87 - 5.11 (MIL/uL)   Hemoglobin 8.7 DELTA CHECK NOTED REPEATED TO VERIFY (*) 12.0 - 15.0 (g/dL)   HCT 16.1 (*) 09.6 - 46.0 (%)   MCV 90.4  78.0 - 100.0 (fL)   MCH 32.1  26.0 - 34.0 (pg)   MCHC 35.5  30.0 - 36.0 (g/dL)   RDW 04.5  40.9 - 81.1 (%)   Platelets 272  150 - 400 (K/uL)   Labs Reviewed - No data to display No results found.   1. Syncope       MDM  Monitor shows normal sinus rhythm without ectopy. Vital signs are stable. No post ictal behavior noted. Neurologically stable        Donnetta Hutching, MD 06/01/11 1530

## 2011-11-12 ENCOUNTER — Encounter (HOSPITAL_COMMUNITY): Payer: Self-pay | Admitting: *Deleted

## 2011-11-12 ENCOUNTER — Emergency Department (HOSPITAL_COMMUNITY)
Admission: EM | Admit: 2011-11-12 | Discharge: 2011-11-12 | Disposition: A | Discharge status: Home / Self Care | Payer: Medicaid Other | Attending: Emergency Medicine | Admitting: Emergency Medicine

## 2011-11-12 DIAGNOSIS — F17200 Nicotine dependence, unspecified, uncomplicated: Secondary | ICD-10-CM | POA: Insufficient documentation

## 2011-11-12 DIAGNOSIS — S01502A Unspecified open wound of oral cavity, initial encounter: Secondary | ICD-10-CM | POA: Insufficient documentation

## 2011-11-12 DIAGNOSIS — IMO0002 Reserved for concepts with insufficient information to code with codable children: Secondary | ICD-10-CM

## 2011-11-12 DIAGNOSIS — R569 Unspecified convulsions: Secondary | ICD-10-CM

## 2011-11-12 DIAGNOSIS — X58XXXA Exposure to other specified factors, initial encounter: Secondary | ICD-10-CM | POA: Insufficient documentation

## 2011-11-12 LAB — POCT PREGNANCY, URINE: Preg Test, Ur: NEGATIVE

## 2011-11-12 LAB — COMPREHENSIVE METABOLIC PANEL
Albumin: 4.2 g/dL (ref 3.5–5.2)
Alkaline Phosphatase: 48 U/L (ref 39–117)
BUN: 9 mg/dL (ref 6–23)
Creatinine, Ser: 1.04 mg/dL (ref 0.50–1.10)
Potassium: 3.5 mEq/L (ref 3.5–5.1)
Total Protein: 7.8 g/dL (ref 6.0–8.3)

## 2011-11-12 LAB — DIFFERENTIAL
Basophils Relative: 0 % (ref 0–1)
Eosinophils Absolute: 0.2 10*3/uL (ref 0.0–0.7)
Monocytes Relative: 7 % (ref 3–12)
Neutrophils Relative %: 45 % (ref 43–77)

## 2011-11-12 LAB — ETHANOL: Alcohol, Ethyl (B): <11 mg/dL (ref 0–11)

## 2011-11-12 LAB — CBC
Hemoglobin: 12.5 g/dL (ref 12.0–15.0)
MCH: 30.3 pg (ref 26.0–34.0)
MCHC: 34.1 g/dL (ref 30.0–36.0)

## 2011-11-12 MED ORDER — OXYCODONE-ACETAMINOPHEN 5-325 MG PO TABS: 1.0000 | ORAL_TABLET | ORAL | Status: AC | PRN

## 2011-11-12 MED ORDER — OXYCODONE-ACETAMINOPHEN 5-325 MG PO TABS
1.0000 | ORAL_TABLET | Freq: Once | ORAL | Status: AC
  Administered 2011-11-12: 1 via ORAL
  Filled 2011-11-12: qty 1

## 2011-11-12 MED ORDER — HYDROCODONE-ACETAMINOPHEN 5-500 MG PO TABS: 1.0000 | ORAL_TABLET | Freq: Four times a day (QID) | ORAL | Status: AC | PRN

## 2011-11-12 NOTE — ED Notes (Signed)
pt ambulates without difficulty. Pt alert x3.

## 2011-11-12 NOTE — ED Notes (Addendum)
Patient states seizure this am resulting in patient falling and cutting upper lip, patient with skin flap on upper lip, bleeding controlled, patient states she has memory problems and cannot remember previous seizures. Patient also c/o left knee pain post fall

## 2011-11-12 NOTE — ED Notes (Signed)
Patient reported to have a seizure today prior to arrival lasting 2 to 3 min,  She had a seizure while here in the ED lasting 2 min with full body movement per family.  Airway is patent.  Patient reports she does take her medications as directed.  She is alert and oriented at this time.  She is groggy in presentation

## 2011-11-12 NOTE — ED Provider Notes (Signed)
History   This chart was scribed for Nelia Shi, MD by Sofie Rower. The patient was seen in room TR11C/TR11C and the patient's care was started at 12:02 PM     CSN: [id number]  Arrival date & time 11/12/11  1101   None     Chief Complaint  Patient presents with  . Seizures  . Laceration    upper lip    (Consider location/radiation/quality/duration/timing/severity/associated sxs/prior treatment) Patient is a 26 y.o. female presenting with seizures and skin laceration. The history is provided by the patient and a relative. No language interpreter was used.  Seizures  This is a recurrent problem. The current episode started 3 to 5 hours ago. The problem has not changed since onset.There was 1 seizure. The seizures did not continue in the ED. There has been no fever.  Laceration  The incident occurred 3 to 5 hours ago. The laceration is located on the face. The laceration mechanism is unknown.The pain is moderate. She reports no foreign bodies present. Her tetanus status is unknown.   Barbara Hodges is a 26 y.o. female who presents to the Emergency Department complaining of moderate, episodic seizure onset today with associated symptoms of laceration located at the upper lip. The pt was using the restroom when the seizure occurred this morning.There was a fall associated with this seizure. The pt relative informs the EDP that the pt takes seizure medications, every night before she goes to sleep. Pt has a hx of seizures.   PCP is Dr. Gaynell Face.    Past Medical History  Diagnosis Date  . Seizures      History  Substance Use Topics  . Smoking status: Current Everyday Smoker  . Smokeless tobacco: Not on file  . Alcohol Use: Yes    OB History    Grav Para Term Preterm Abortions TAB SAB Ect Mult Living                  Review of Systems  Neurological: Positive for seizures.  All other systems reviewed and are negative.    Allergies  Food allergy formula and  Hydrocodone  Home Medications   Current Outpatient Rx  Name Route Sig Dispense Refill  . CLONAZEPAM 1 MG PO TABS Oral Take 1 mg by mouth 2 (two) times daily.    Marland Kitchen ZONISAMIDE 100 MG PO CAPS Oral Take 100 mg by mouth daily.      BP 107/76  Pulse 78  Temp 99.6 F (37.6 C) (Oral)  Resp 16  SpO2 97%  LMP 10/19/2011  Physical Exam  Nursing note and vitals reviewed. Constitutional: She is oriented to person, place, and time. She appears well-developed and well-nourished. No distress.  HENT:  Head: Normocephalic.  Mouth/Throat:         Flap laceration noted to upper lip.  Patient was undergoing a regional block for anesthesia the upper lip which she developed a second tonic-clonic seizure which lasted approximately 30-45 seconds.  She was postictal after the seizure.  Because this is her second seizure today and the fact that she is on antiepileptic medication she was moved to the main ER for further evaluation.  Eyes: Pupils are equal, round, and reactive to light.  Neck: Normal range of motion.  Cardiovascular: Normal rate and intact distal pulses.   Pulmonary/Chest: No respiratory distress.  Abdominal: Normal appearance. She exhibits no distension.  Musculoskeletal: Normal range of motion.  Neurological: She is alert and oriented to person, place, and time. No  cranial nerve deficit.  Skin: Skin is warm and dry. No rash noted.  Psychiatric: She has a normal mood and affect. Her behavior is normal.    ED Course  Procedures (including critical care time)  DIAGNOSTIC STUDIES: Oxygen Saturation is 97% on room air, normal by my interpretation.    COORDINATION OF CARE:  12:03PM- EDP at bedside discusses treatment plan concerning laceration.  12:44PM- EDP at bedside to conduct laceration repair.    Labs Reviewed - No data to display No results found.   #1.  Tonic-clonic seizures #2.  Upper lip laceration   MDM        I personally performed the services described  in this documentation, which was scribed in my presence. The recorded information has been reviewed and considered.     Nelia Shi, MD 11/12/11 [phone]

## 2011-11-12 NOTE — Discharge Instructions (Signed)
Laceration Care, Adult A laceration is a cut or lesion that goes through all layers of the skin and into the tissue just beneath the skin. TREATMENT  Some lacerations may not require closure. Some lacerations may not be able to be closed due to an increased risk of infection. It is important to see your caregiver as soon as possible after an injury to minimize the risk of infection and maximize the opportunity for successful closure. If closure is appropriate, pain medicines may be given, if needed. The wound will be cleaned to help prevent infection. Your caregiver will use stitches (sutures), staples, wound glue (adhesive), or skin adhesive strips to repair the laceration. These tools bring the skin edges together to allow for faster healing and a better cosmetic outcome. However, all wounds will heal with a scar. Once the wound has healed, scarring can be minimized by covering the wound with sunscreen during the day for 1 full year. HOME CARE INSTRUCTIONS  For sutures or staples:  Keep the wound clean and dry.   If you were given a bandage (dressing), you should change it at least once a day. Also, change the dressing if it becomes wet or dirty, or as directed by your caregiver.   Wash the wound with soap and water 2 times a day. Rinse the wound off with water to remove all soap. Pat the wound dry with a clean towel.   After cleaning, apply a thin layer of the antibiotic ointment as recommended by your caregiver. This will help prevent infection and keep the dressing from sticking.   You may shower as usual after the first 24 hours. Do not soak the wound in water until the sutures are removed.   Only take over-the-counter or prescription medicines for pain, discomfort, or fever as directed by your caregiver.   Get your sutures or staples removed as directed by your caregiver.  For skin adhesive strips:  Keep the wound clean and dry.   Do not get the skin adhesive strips wet. You may bathe  carefully, using caution to keep the wound dry.   If the wound gets wet, pat it dry with a clean towel.   Skin adhesive strips will fall off on their own. You may trim the strips as the wound heals. Do not remove skin adhesive strips that are still stuck to the wound. They will fall off in time.  For wound adhesive:  You may briefly wet your wound in the shower or bath. Do not soak or scrub the wound. Do not swim. Avoid periods of heavy perspiration until the skin adhesive has fallen off on its own. After showering or bathing, gently pat the wound dry with a clean towel.   Do not apply liquid medicine, cream medicine, or ointment medicine to your wound while the skin adhesive is in place. This may loosen the film before your wound is healed.   If a dressing is placed over the wound, be careful not to apply tape directly over the skin adhesive. This may cause the adhesive to be pulled off before the wound is healed.   Avoid prolonged exposure to sunlight or tanning lamps while the skin adhesive is in place. Exposure to ultraviolet light in the first year will darken the scar.   The skin adhesive will usually remain in place for 5 to 10 days, then naturally fall off the skin. Do not pick at the adhesive film.  You may need a tetanus shot if:  You   cannot remember when you had your last tetanus shot.   You have never had a tetanus shot.  If you get a tetanus shot, your arm may swell, get red, and feel warm to the touch. This is common and not a problem. If you need a tetanus shot and you choose not to have one, there is a rare chance of getting tetanus. Sickness from tetanus can be serious. SEEK MEDICAL CARE IF:   You have redness, swelling, or increasing pain in the wound.   You see a red line that goes away from the wound.   You have yellowish-white fluid (pus) coming from the wound.   You have a fever.   You notice a bad smell coming from the wound or dressing.   Your wound breaks  open before or after sutures have been removed.   You notice something coming out of the wound such as wood or glass.   Your wound is on your hand or foot and you cannot move a finger or toe.  SEEK IMMEDIATE MEDICAL CARE IF:   Your pain is not controlled with prescribed medicine.   You have severe swelling around the wound causing pain and numbness or a change in color in your arm, hand, leg, or foot.   Your wound splits open and starts bleeding.   You have worsening numbness, weakness, or loss of function of any joint around or beyond the wound.   You develop painful lumps near the wound or on the skin anywhere on your body.  MAKE SURE YOU:   Understand these instructions.   Will watch your condition.   Will get help right away if you are not doing well or get worse.  Document Released: 05/06/2005 Document Revised: 04/25/2011 Document Reviewed: 10/30/2010 [facility] Patient Information 2012 [place], [place].Seizure, Adult A seizure happens when there is uncontrolled activity in the brain. Most seizures cause either the whole body to shake or just one part of the body to shake. The shaking is called a convulsion. Other seizures cause the body or a part of the body to tense up.  HOME CARE   Keep all follow-up visits.   Take medicines as told by your doctor.   Do not swim until your doctor says it is okay.   Do not drive until your doctor says it is okay.   Make sure your friends, loved ones, and coworkers know to do the following things if you have a seizure:   Lower you gently to the ground.   Keep the area around you free of things that might fall or things that you might grab.   Avoid putting anything in or near your mouth.   Call your local emergency services (911 in [place]  GET HELP RIGHT AWAY IF:   You have confusion or feel dazed.   You feel sleepy.  MAKE SURE YOU:   Understand these instructions.   Will watch your condition.   Will get help right away if  you are not doing well or get worse.  Document Released: 10/23/2007 Document Revised: 04/25/2011 Document Reviewed: 04/24/2011 [facility] Patient Information 2012 [place], [place].

## 2011-11-12 NOTE — ED Notes (Signed)
Patient a/o x 4 at this time, patient with no seizure symptoms and is not post ictal at this time

## 2011-11-12 NOTE — ED Provider Notes (Signed)
History     CSN: [id number]  Arrival date & time 11/12/11  1101   First MD Initiated Contact with Patient 11/12/11 1204      Chief Complaint  Patient presents with  . Seizures  . Laceration    upper lip    Patient is a 26 y.o. female presenting with seizures. The history is provided by the patient and the spouse.  Seizures  This is a recurrent problem. The current episode started 1 to 2 hours ago. The problem has been resolved. There were 2 to 3 seizures. The most recent episode lasted 30 to 120 seconds. Pertinent negatives include no sleepiness, patient does not experience confusion, no headaches, no speech difficulty, no neck stiffness, no chest pain, no cough, no nausea, no vomiting, no diarrhea and no muscle weakness. Characteristics include eye blinking, rhythmic jerking and loss of consciousness. Characteristics do not include eye deviation, bowel incontinence or bladder incontinence. The seizure(s) had no focality. Possible causes include missed seizure meds (did not take seizure meds Saturday or Sunday ). Possible causes do not include med or dosage change, sleep deprivation, recent illness or change in alcohol use. There has been no fever. There were no medications administered prior to arrival.    Past Medical History  Diagnosis Date  . Seizures     History reviewed. No pertinent past surgical history.  No family history on file.  History  Substance Use Topics  . Smoking status: Current Everyday Smoker  . Smokeless tobacco: Not on file  . Alcohol Use: Yes    OB History    Grav Para Term Preterm Abortions TAB SAB Ect Mult Living                  Review of Systems  Constitutional: Negative for fever and chills.  HENT: Negative for neck pain and neck stiffness.   Respiratory: Negative for cough, chest tightness, shortness of breath and wheezing.   Cardiovascular: Negative for chest pain and palpitations.  Gastrointestinal: Negative for nausea, vomiting, abdominal  pain, diarrhea, constipation and bowel incontinence.  Genitourinary: Negative for bladder incontinence, dysuria, decreased urine volume and difficulty urinating.  Skin: Positive for wound (3 cm lac to upper lip). Negative for rash.  Neurological: Positive for tremors, seizures and loss of consciousness. Negative for dizziness, syncope, facial asymmetry, speech difficulty, weakness, light-headedness, numbness and headaches.  Psychiatric/Behavioral: Negative for confusion and agitation.  All other systems reviewed and are negative.    Allergies  Food allergy formula and Hydrocodone  Home Medications   Current Outpatient Rx  Name Route Sig Dispense Refill  . CLONAZEPAM 1 MG PO TABS Oral Take 0.5 mg by mouth 2 (two) times daily.     Marland Kitchen LAMOTRIGINE 25 MG PO TABS Oral Take 25 mg by mouth daily. Take one tablet by mouth every night for 2 weeks and then start taking 1 tablet 2 times a day    . ZONISAMIDE 100 MG PO CAPS Oral Take 100 mg by mouth daily.      BP 104/63  Pulse 77  Temp 98.9 F (37.2 C) (Oral)  Resp 19  SpO2 100%  LMP 10/19/2011  Physical Exam  Nursing note and vitals reviewed. Constitutional: She is oriented to person, place, and time. She appears well-developed and well-nourished.  HENT:  Head: Normocephalic and atraumatic.  Right Ear: External ear normal.  Left Ear: External ear normal.  Nose: Nose normal.  Mouth/Throat: Oropharynx is clear and moist. No oropharyngeal exudate.  Eyes: Conjunctivae are  normal. Pupils are equal, round, and reactive to light.  Neck: Normal range of motion. Neck supple.  Cardiovascular: Normal rate, regular rhythm, normal heart sounds and intact distal pulses.  Exam reveals no gallop and no friction rub.   No murmur heard. Pulmonary/Chest: Effort normal and breath sounds normal. No respiratory distress. She has no wheezes. She has no rales. She exhibits no tenderness.  Abdominal: Soft. Bowel sounds are normal. She exhibits no distension  and no mass. There is no tenderness. There is no rebound and no guarding.  Musculoskeletal: Normal range of motion. She exhibits no edema and no tenderness.  Neurological: She is alert and oriented to person, place, and time. She displays normal reflexes. No cranial nerve deficit. She exhibits normal muscle tone. Coordination normal.  Skin: Skin is warm and dry.       3cm laceration to upper lip, hemostatic  Psychiatric: She has a normal mood and affect. Her behavior is normal. Judgment and thought content normal.    ED Course  LACERATION REPAIR Date/Time: 11/12/2011 3:06 PM Performed by: Clemetine Marker Authorized by: Raeford Razor Consent: Verbal consent obtained. Risks and benefits: risks, benefits and alternatives were discussed Consent given by: patient Patient understanding: patient states understanding of the procedure being performed Body area: mouth Location details: upper lip, interior Laceration length: 3 cm Tendon involvement: none Nerve involvement: none Vascular damage: no Anesthesia: local infiltration Local anesthetic: lidocaine 1% with epinephrine Anesthetic total: 4 ml Patient sedated: no Preparation: Patient was prepped and draped in the usual sterile fashion. Irrigation solution: saline Irrigation method: jet lavage Amount of cleaning: standard Debridement: none Degree of undermining: none Mucous membrane closure: 5-0 Chromic gut Number of sutures: 5 Technique: simple Approximation: close Approximation difficulty: simple Patient tolerance: Patient tolerated the procedure well with no immediate complications.   (including critical care time)  Labs Reviewed  DIFFERENTIAL - Abnormal; Notable for the following:    Lymphs Abs 4.3 (*)     All other components within normal limits  COMPREHENSIVE METABOLIC PANEL - Abnormal; Notable for the following:    CO2 15 (*)     Glucose, Bld 132 (*)     GFR calc non Af Amer 75 (*)     GFR calc Af Amer 87 (*)     All  other components within normal limits  CBC  ETHANOL   No results found.   No diagnosis found.    MDM  26 yo F w/hx of seizures presents after two seizures, similar in nature previous, including one episode here in the ED, which consisted of several minutes of generalized tonic-clonic shaking. Pt's mental status returned to baseline between events; clinical picture not c/w status epilepticus. Patient's mental status at baseline throughout my exam in ED. No signs or symptoms of fever and afebrile. Laceration to upper lip from first seizure, repaired in ED (see procedure note) without complications-fast absorbing gut that will not have to be removed. Patient given return precautions, including worsening of signs or symptoms. Patient instructed to follow-up with primary care physician.          Clemetine Marker, MD 11/12/11 [phone]

## 2011-11-18 NOTE — ED Provider Notes (Signed)
I saw and evaluated the patient, reviewed the resident's note and I agree with the findings and plan.  25yF with 2 seizures today with return to baseline between and back to baseline currently. Nonfocal neuro exam. Lip lac which repaired by Dr Noe Gens. Seizure hx. Afebrile. No head trauma. Feel that safe for DC at this time with outpt fu.  Raeford Razor, MD 11/18/11 0111

## 2011-11-20 ENCOUNTER — Emergency Department (HOSPITAL_COMMUNITY)
Admission: EM | Admit: 2011-11-20 | Discharge: 2011-11-20 | Disposition: A | Discharge status: Home / Self Care | Payer: Medicaid Other | Source: Home / Self Care

## 2011-11-20 ENCOUNTER — Encounter (HOSPITAL_COMMUNITY): Payer: Self-pay

## 2011-11-20 DIAGNOSIS — S01501A Unspecified open wound of lip, initial encounter: Secondary | ICD-10-CM

## 2011-11-20 DIAGNOSIS — S01511A Laceration without foreign body of lip, initial encounter: Secondary | ICD-10-CM

## 2011-11-20 NOTE — ED Notes (Signed)
Pt states she had a seizure 5 days ago, fell and sustained a laceration to her upper lip.  States the sutures came out yesterday and now she has a piece of her lip hanging down.

## 2011-11-20 NOTE — ED Provider Notes (Signed)
History     CSN: [id number]  Arrival date & time 11/20/11  1853   None     Chief Complaint  Patient presents with  . Lip Laceration    (Consider location/radiation/quality/duration/timing/severity/associated sxs/prior treatment) HPI Comments: Pt had a seizure last week, bit through lip.  Yesterday the sutures came out, and now a flap of her lip is hanging down.   Patient is a 26 y.o. female presenting with skin laceration. The history is provided by the patient.  Laceration  Incident onset: a week ago. Pain location:  upper lip. Size: 7mm. Injury mechanism: tooth. The pain is at a severity of 8/10.    Past Medical History  Diagnosis Date  . Seizures     Past Surgical History  Procedure Date  . Tubal ligation     No family history on file.  History  Substance Use Topics  . Smoking status: Current Everyday Smoker  . Smokeless tobacco: Not on file  . Alcohol Use: Yes    OB History    Grav Para Term Preterm Abortions TAB SAB Ect Mult Living                  Review of Systems  Constitutional: Negative for fever and chills.  HENT:       Lip laceration  Skin:       See HENT    Allergies  Food allergy formula and Hydrocodone  Home Medications   Current Outpatient Rx  Name Route Sig Dispense Refill  . CLONAZEPAM 1 MG PO TABS Oral Take 0.5 mg by mouth 2 (two) times daily.     Marland Kitchen LAMOTRIGINE 25 MG PO TABS Oral Take 25 mg by mouth daily. Take one tablet by mouth every night for 2 weeks and then start taking 1 tablet 2 times a day    . ZONISAMIDE 100 MG PO CAPS Oral Take 100 mg by mouth daily.    Marland Kitchen HYDROCODONE-ACETAMINOPHEN 5-500 MG PO TABS Oral Take 1 tablet by mouth every 6 (six) hours as needed for pain. 10 tablet 0  . OXYCODONE-ACETAMINOPHEN 5-325 MG PO TABS Oral Take 1 tablet by mouth every 4 (four) hours as needed for pain. 10 tablet 0    BP 113/61  Pulse 59  Temp 99.1 F (37.3 C) (Oral)  Resp 19  SpO2 100%  LMP 10/19/2011  Physical Exam    Constitutional: She appears well-developed and well-nourished. No distress.  HENT:  Head: Normocephalic.  Mouth/Throat: Abnormal dentition. Dental caries present.         Multiple old broken teeth with significant decay  Skin: Skin is warm and dry.       See HENT exam    ED Course  LACERATION REPAIR Date/Time: 11/20/2011 8:30 PM Performed by: Cathlyn Parsons Authorized by: Cathlyn Parsons Consent: Verbal consent obtained. Risks and benefits: risks, benefits and alternatives were discussed Consent given by: patient Patient understanding: patient states understanding of the procedure being performed Patient identity confirmed: verbally with patient Body area: mouth Location details: upper lip, interior Laceration length: 0.7 cm Anesthesia: local infiltration Local anesthetic: lidocaine 2% with epinephrine Anesthetic total: 0.5 ml Patient sedated: no Irrigation solution: saline Debridement: none Degree of undermining: minimal Wound mucous membrane closure material used: 4.0 vicryl rapide. Number of sutures: 1 Technique: simple Approximation: close Approximation difficulty: simple Patient tolerance: Patient tolerated the procedure well with no immediate complications.   (including critical care time)  Labs Reviewed - No data to display No results  found.   1. Lip laceration       MDM          Cathlyn Parsons, NP 11/20/11 2100

## 2011-11-21 NOTE — ED Provider Notes (Signed)
Medical screening examination/treatment/procedure(s) were performed by non-physician practitioner and as supervising physician I was immediately available for consultation/collaboration.  Theodus Ran   Izzie Geers, MD 11/21/11 1457 

## 2012-08-14 ENCOUNTER — Encounter: Payer: Self-pay | Admitting: Nurse Practitioner

## 2012-08-14 DIAGNOSIS — E785 Hyperlipidemia, unspecified: Secondary | ICD-10-CM | POA: Insufficient documentation

## 2012-08-14 DIAGNOSIS — I10 Essential (primary) hypertension: Secondary | ICD-10-CM

## 2012-08-14 DIAGNOSIS — Z08 Encounter for follow-up examination after completed treatment for malignant neoplasm: Secondary | ICD-10-CM

## 2013-03-02 ENCOUNTER — Emergency Department (HOSPITAL_COMMUNITY)
Admission: EM | Admit: 2013-03-02 | Discharge: 2013-03-02 | Disposition: A | Discharge status: Home / Self Care | Payer: Medicaid Other | Attending: Emergency Medicine | Admitting: Emergency Medicine

## 2013-03-02 ENCOUNTER — Encounter (HOSPITAL_COMMUNITY): Payer: Self-pay | Admitting: Emergency Medicine

## 2013-03-02 DIAGNOSIS — Z8739 Personal history of other diseases of the musculoskeletal system and connective tissue: Secondary | ICD-10-CM | POA: Insufficient documentation

## 2013-03-02 DIAGNOSIS — Z8543 Personal history of malignant neoplasm of ovary: Secondary | ICD-10-CM | POA: Insufficient documentation

## 2013-03-02 DIAGNOSIS — R569 Unspecified convulsions: Secondary | ICD-10-CM

## 2013-03-02 DIAGNOSIS — Z8742 Personal history of other diseases of the female genital tract: Secondary | ICD-10-CM | POA: Insufficient documentation

## 2013-03-02 DIAGNOSIS — G40909 Epilepsy, unspecified, not intractable, without status epilepticus: Secondary | ICD-10-CM | POA: Insufficient documentation

## 2013-03-02 DIAGNOSIS — E559 Vitamin D deficiency, unspecified: Secondary | ICD-10-CM | POA: Insufficient documentation

## 2013-03-02 DIAGNOSIS — J45909 Unspecified asthma, uncomplicated: Secondary | ICD-10-CM | POA: Insufficient documentation

## 2013-03-02 DIAGNOSIS — Z79899 Other long term (current) drug therapy: Secondary | ICD-10-CM | POA: Insufficient documentation

## 2013-03-02 DIAGNOSIS — I10 Essential (primary) hypertension: Secondary | ICD-10-CM | POA: Insufficient documentation

## 2013-03-02 DIAGNOSIS — R011 Cardiac murmur, unspecified: Secondary | ICD-10-CM | POA: Insufficient documentation

## 2013-03-02 DIAGNOSIS — F17200 Nicotine dependence, unspecified, uncomplicated: Secondary | ICD-10-CM | POA: Insufficient documentation

## 2013-03-02 LAB — CBC WITH DIFFERENTIAL/PLATELET
Basophils Relative: 1 % (ref 0–1)
Eosinophils Absolute: 0.1 10*3/uL (ref 0.0–0.7)
HCT: 35.7 % — ABNORMAL LOW (ref 36.0–46.0)
Hemoglobin: 12.2 g/dL (ref 12.0–15.0)
Lymphs Abs: 2.1 10*3/uL (ref 0.7–4.0)
MCH: 30.3 pg (ref 26.0–34.0)
MCHC: 34.2 g/dL (ref 30.0–36.0)
MCV: 88.8 fL (ref 78.0–100.0)
Monocytes Absolute: 0.4 10*3/uL (ref 0.1–1.0)
Neutro Abs: 2 10*3/uL (ref 1.7–7.7)
Neutrophils Relative %: 44 % (ref 43–77)

## 2013-03-02 LAB — BASIC METABOLIC PANEL
BUN: 7 mg/dL (ref 6–23)
GFR calc non Af Amer: >90 mL/min (ref >90)
Glucose, Bld: 79 mg/dL (ref 70–99)
Potassium: 3.8 mEq/L (ref 3.5–5.1)

## 2013-03-02 MED ORDER — CLONAZEPAM 0.5 MG PO TABS: 2.0000 mg | ORAL_TABLET | Freq: Two times a day (BID) | ORAL | Status: DC | PRN

## 2013-03-02 MED ORDER — LAMOTRIGINE 25 MG PO TABS: 50.0000 mg | ORAL_TABLET | Freq: Every day | ORAL | Status: DC

## 2013-03-02 MED ORDER — ZONISAMIDE 100 MG PO CAPS: 800.0000 mg | ORAL_CAPSULE | Freq: Every day | ORAL | Status: DC

## 2013-03-02 MED ORDER — LORAZEPAM 1 MG PO TABS
1.0000 mg | ORAL_TABLET | Freq: Once | ORAL | Status: AC
  Administered 2013-03-02: 1 mg via ORAL
  Filled 2013-03-02: qty 1

## 2013-03-02 MED ORDER — LAMOTRIGINE 25 MG PO TABS
50.0000 mg | ORAL_TABLET | Freq: Two times a day (BID) | ORAL | Status: DC
  Administered 2013-03-02: 50 mg via ORAL
  Filled 2013-03-02: qty 2

## 2013-03-02 MED ORDER — CLONAZEPAM 0.5 MG PO TABS
2.0000 mg | ORAL_TABLET | Freq: Two times a day (BID) | ORAL | Status: DC
Start: 2013-03-02 — End: 2013-03-03
  Administered 2013-03-02: 2 mg via ORAL
  Filled 2013-03-02: qty 4

## 2013-03-02 MED ORDER — ZONISAMIDE 100 MG PO CAPS
800.0000 mg | ORAL_CAPSULE | Freq: Every day | ORAL | Status: DC
  Administered 2013-03-02: 800 mg via ORAL
  Filled 2013-03-02: qty 8

## 2013-03-02 NOTE — ED Notes (Signed)
Allena Katz MD, Neurology.

## 2013-03-02 NOTE — ED Notes (Signed)
Wrong documentation for 1942

## 2013-03-02 NOTE — ED Notes (Signed)
Pt reports hx of seizures, has been out of meds x 4 days and reports having two seizures. No acute distress noted at triage, airway intact.

## 2013-03-02 NOTE — ED Provider Notes (Signed)
CSN: [id number]     Arrival date & time 03/02/13  1746 History   First MD Initiated Contact with Patient 03/02/13 2030     Chief Complaint  Patient presents with  . Seizures   (Consider location/radiation/quality/duration/timing/severity/associated sxs/prior Treatment) HPI Comments: Patient is a 27 year old female with history of seizures, fibroids, ovarian cancer, hypertension, hyperlipemia, arthritis, heart murmur, asthma, and vitamin D deficiency disease who presents today after having seizures for the past 5 days. She has not been taking her medications for the past 5 days as she ran out of her medications. She is switching primary care physicians and has an appointment with her new physician sometime next week. The pharmacy will not fill her rx until she is seen by the new physician. She has auras for her seizures and has been post ictal. She currently feels like she will have another seizure if she doesn't get medication soon. Her last seizure was earlier tonight although the time was unclear. She is oriented x 4 currently. No recent illness, fevers, chills, cough, cold, congestion, nausea, vomiting, abdominal pain, shortness of breath, chest pain. She does have mild pain in her inner left cheek where she believes she bit herself during one of her seizures.   The history is provided by the patient. No language interpreter was used.    Past Medical History  Diagnosis Date  . Seizures   . Fibroids, intramural 12/02/1995  . 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 Hypertension   . Hyperlipidemia 2003  . Obesity, Class III, BMI 40-49.9 (morbid obesity)   . Arthritis   . Heart murmur   . Asthma   . Vitamin D deficiency disease    Past Surgical History  Procedure Laterality Date  . Tubal ligation    . 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 Ronita Hipps & Dr. Myrlene Broker  . Abdominal hysterectomy  07/21/2001    see comment about ovary  . Colonoscopy w/ biopsies  2005    3 benign polyps  . Colonoscopy w/ biopsies  12/2005    hyperplastic polyp.   Recheck 5 yrs.   Family History  Problem Relation Age of Onset  . Diabetes Mother   . Hypertension Mother   . Hypertension Sister   . Heart disease Brother   . Hypertension Brother    History  Substance Use Topics  . Smoking status: Current Every Day Smoker  . Smokeless tobacco: Never Used  . Alcohol Use: Yes   OB History   Grav Para Term Preterm Abortions TAB SAB Ect Mult Living   2 1 1  1           Review of Systems  Constitutional: Negative for fever and chills.  Respiratory: Negative for shortness of breath.   Cardiovascular: Negative for chest pain.  Gastrointestinal: Negative for nausea, vomiting and abdominal pain.  Neurological: Positive for seizures.  All other systems reviewed and are negative.    Allergies  Lipitor; Food allergy formula; and Hydrocodone  Home Medications   Current Outpatient Rx  Name  Route  Sig  Dispense  Refill  . cholecalciferol (VITAMIN D) 1000 UNITS tablet   Oral   Take 1,000 Units by mouth daily.         . clonazePAM (KLONOPIN) 1 MG tablet   Oral   Take 2 mg by mouth 2 (two) times  daily.          . lamoTRIgine (LAMICTAL) 25 MG tablet   Oral   Take 50 mg by mouth 2 (two) times daily.          . Multiple Vitamin (MULTIVITAMIN WITH MINERALS) TABS tablet   Oral   Take 1 tablet by mouth daily.         Marland Kitchen zonisamide (ZONEGRAN) 100 MG capsule   Oral   Take 800 mg by mouth daily.           BP 109/76  Pulse 72  Temp(Src) 98.3 F (36.8 C) (Oral)  Resp 18  SpO2 100%  LMP 02/15/2013 Physical Exam  Nursing note and vitals reviewed. Constitutional: She is oriented to person, place, and time. She appears well-developed and well-nourished. No distress.  HENT:  Head: Normocephalic and atraumatic.  Right Ear: External ear normal.  Left  Ear: External ear normal.  Nose: Nose normal.  Mouth/Throat: Oropharynx is clear and moist. Abnormal dentition.  Poor dentition. No tongue laceration visible.   Eyes: Conjunctivae and EOM are normal. Pupils are equal, round, and reactive to light.  Neck: Normal range of motion.  Cardiovascular: Normal rate, regular rhythm and normal heart sounds.   Pulmonary/Chest: Effort normal and breath sounds normal. No stridor. No respiratory distress. She has no wheezes. She has no rales.  Abdominal: Soft. She exhibits no distension.  Musculoskeletal: Normal range of motion.  Neurological: She is alert and oriented to person, place, and time. She has normal strength. No cranial nerve deficit or sensory deficit. Coordination normal.  Skin: Skin is warm and dry. She is not diaphoretic. No erythema.  Psychiatric: Her behavior is normal.  Blunt affect    ED Course  Procedures (including critical care time) Labs Review Labs Reviewed  CBC WITH DIFFERENTIAL - Abnormal; Notable for the following:    HCT 35.7 (*)    All other components within normal limits  BASIC METABOLIC PANEL  LAMOTRIGINE LEVEL   Imaging Review No results found.  EKG Interpretation   None       MDM   1. Seizures    Patient presents after having seizures for the past 5 days. She has not been taking her medications. Gave home medicines in ED and patient was energetic and cooperative. Alert and oriented x 4. No focal neuro deficits. Labs unremarkable. Lamotrigine level pending. Done for expedited follow up. Patient reports she felt at her baseline. She has an appointment with Dr. Allena Katz on Monday where she will get her prescriptions refilled. She was given an rx for her home meds to hold her until that time. Return instructions given. Vital signs stable for discharge. Discussed case with Dr. Jodi Mourning who agrees with plan. Patient / Family / Caregiver informed of clinical course, understand medical decision-making process, and agree  with plan.     Mora Bellman, PA-C 03/03/13 [phone]

## 2013-03-03 NOTE — ED Provider Notes (Signed)
Medical screening examination/treatment/procedure(s) were conducted as a shared visit with non-physician practitioner(s) or resident  and myself.  I personally evaluated the patient during the encounter and agree with the findings and plan unless otherwise indicated.    Known seizure hx, pt has not been taking meds for one week.  One seizure today, similar to previous, no significant head injury.  Mild contusion to lateral tongue, 5+ strength in UE and LE with f/e at major joints. Sensation to palpation intact in UE and LE. CNs 2-12 grossly intact.  EOMFI.  PERRL.   Finger nose and coordination intact bilateral.   Visual fields intact to finger testing. No seizures in ED.  Seizure meds given.   Labs Reviewed  CBC WITH DIFFERENTIAL - Abnormal; Notable for the following:    HCT 35.7 (*)    All other components within normal limits  BASIC METABOLIC PANEL  LAMOTRIGINE LEVEL   Close fup and taking meds appropriately stressed.   Seizures DC  Enid Skeens, MD 03/03/13 [id number]

## 2013-03-04 ENCOUNTER — Encounter: Payer: Self-pay | Admitting: Neurology

## 2013-03-04 ENCOUNTER — Ambulatory Visit (INDEPENDENT_AMBULATORY_CARE_PROVIDER_SITE_OTHER): Payer: Medicaid Other | Admitting: Neurology

## 2013-03-04 VITALS — BP 90/60 | HR 80 | Temp 98.3°F | Ht 66.0 in | Wt 130.0 lb

## 2013-03-04 DIAGNOSIS — Z9119 Patient's noncompliance with other medical treatment and regimen: Secondary | ICD-10-CM

## 2013-03-04 DIAGNOSIS — G40309 Generalized idiopathic epilepsy and epileptic syndromes, not intractable, without status epilepticus: Secondary | ICD-10-CM

## 2013-03-04 DIAGNOSIS — Z9114 Patient's other noncompliance with medication regimen: Secondary | ICD-10-CM

## 2013-03-04 DIAGNOSIS — G40B09 Juvenile myoclonic epilepsy, not intractable, without status epilepticus: Secondary | ICD-10-CM | POA: Insufficient documentation

## 2013-03-04 MED ORDER — LAMOTRIGINE 25 MG PO TABS: 50.0000 mg | ORAL_TABLET | Freq: Every day | ORAL | Status: DC

## 2013-03-04 MED ORDER — CLONAZEPAM 1 MG PO TABS: 2.0000 mg | ORAL_TABLET | Freq: Two times a day (BID) | ORAL | Status: DC

## 2013-03-04 MED ORDER — ZONISAMIDE 100 MG PO CAPS: 800.0000 mg | ORAL_CAPSULE | Freq: Every day | ORAL | Status: DC

## 2013-03-04 NOTE — Patient Instructions (Signed)
Follow up with us in 2 months

## 2013-03-04 NOTE — Progress Notes (Signed)
[facility] Clinic Note - Initial Visit   Date: 03/04/2013   Barbara Hodges MRN: [id number] DOB: 1985/07/07   Dear Dr Gaynell Face:  Thank you for your kind referral of Barbara Hodges for consultation of seizures. Although her history is well known to you, please allow [place] to reiterate it for the purpose of our medical record. The patient was accompanied to the clinic by fiance.   History of Present Illness: Barbara Hodges is a 27 y.o. year-old right-handed African American female with history of juvenile myoclonic epilepsy (dx 2007) presenting for evaluation of seizures.    History of Present Illness:   Seizures began at the age of 27.  She was in the bathroom, got out of the shower and woke up in a puddle of blood because she hit her head on the sink.  She injured her face and chipped all her teeth. This first seizure occurred in her second trimester of her second pregnancy. The patient has aura which is described as because she hands stars to shake and twitches sometimes.  Observers who have witnessed the seizures, describe them a certain sound that she makes "eeee", her arm are extended with flexed wrists and appear tonic, eyes and heads roll back, legs are are really stiff.  There is loss of consciousness.  There is history of tongue biting and urinary incontinence.  Seizure duration is typically 2-3. The last seizure occurred 03/02/2013 in the setting medication noncompliance (stopped taking it 5-days ago). The typical frequency of seizures is weekly, but only when she is not taking medications. Seizure triggers include stress, loud noises, and bright lights. Myoclonic jerks are more frequent and can occur every few days.  EEG performed in 2009 demonstrated intermittent high voltage generalized 4-5 Hz polyspike and wave discharges suggestive of generalized epilepsy, likely juvenile myoclonic epilepsy.  She was initially placed on Keppra and then Lamictal, but continued  to have poor seizure control following pregnancy. She continued on these 2 AEDs until 2008 but still had breakthrough seizures. Keppra was increased to 750 twice a day and GTCs were reduced 1-2/week.  In 2009, she was started on Zonegran Clonopin which produced seizures down to 1-2 per month. 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 Her last followup and [facility] was in February 2013 at which time patient was on Zonegran 600 mg daily and Lamictal was restarted. She was also continued on Klonopin.   The longest seizure-free interval has been 50-month. There is no history status epilepticus.  She will cluster at times where seizures will occur 1-3 times, last occuring in 2013.  She has four children (ages 14, 27, 4, 2 - all healthy).  Her last pregnancy was complicated because her daughter was apneic at birth but was quickly resusciated.  She had seizures during delivery and during pregnancy.  She is currently not on OCPs, but reports having her fallopian tubes tied.  She has been seeing Dr. Evelene Croon at [facility] for the past 2-years who increased zonisamide from 600-800mg  daily and lamictal 50mg  daily.  Prior notes, it seems that she has a history of medication noncompliance and does not for follow-up in the clinic which has also contributed to increased seizures.  Her fianc says that when she takes her medications for seizures are well controlled but within 2 days of stopping AEDs  she did she will have a GTC seizure.  Postictal symptoms include:  confused, tired  Possible lateralizing signs by history: none  Birth and Early Development: she was   RISK FACTORS FOR SEIZURES:   1. Head Trauma:  Yes, with first seizure; 2. CNS Infections:  no; 3. Family History of Seizures: no; 4. Developmental Delay: none; 5. Febrile Seizures : none; 6.  CNS Tumors: no; 7. CNS Vascular Disease: none; 8. Significant Medical History: none.  CURRENT ANTICONVULSANTS: Lamotrigine 50mg  daily Zonisamide 800mg  daily  The patient forgets a dose: once per week and has started to use an alarm on her phone  The patient's side effects to the current medications: none  PRIOR ANTICONVULSANT HISTORY: Depakote, dilantin, Keppra, xanax - stopped because she continued to have seziures  The following AEDS were associated with side effects:  None   Past Medical History  Diagnosis Date  . Seizures     Age of 27  . Fibroids, intramural 12/02/1995  . 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 Hypertension   . Hyperlipidemia 2003  . Obesity, Class III, BMI 40-49.9 (morbid obesity)   . Arthritis   . Heart murmur   . Asthma   . Vitamin D deficiency disease   . Generalized headaches     Past Surgical History  Procedure Laterality Date  . Tubal ligation    . 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 Ronita Hipps & Dr. Myrlene Broker  . Abdominal hysterectomy  07/21/2001    see comment about ovary  . Colonoscopy w/ biopsies  2005    3 benign polyps  . Colonoscopy w/ biopsies  12/2005    hyperplastic polyp.   Recheck 5 yrs.     Medications:  Current Outpatient Prescriptions on File Prior to Visit  Medication Sig Dispense Refill  . cholecalciferol (VITAMIN D) 1000 UNITS tablet Take 1,000 Units by mouth daily.      . clonazePAM (KLONOPIN) 0.5 MG tablet Take 4 tablets (2 mg total) by mouth 2 (two) times daily as needed (seizures).  24 tablet  0  . clonazePAM (KLONOPIN) 1 MG tablet Take 2 mg by mouth 2 (two) times daily.       Marland Kitchen lamoTRIgine (LAMICTAL) 25 MG tablet Take 2 tablets (50 mg total) by mouth daily.  14 tablet  0  . Multiple Vitamin (MULTIVITAMIN WITH MINERALS) TABS tablet Take 1 tablet by mouth daily.      Marland Kitchen zonisamide (ZONEGRAN)  100 MG capsule Take 8 capsules (800 mg total) by mouth daily.  48 capsule  0   No current facility-administered medications on file prior to visit.    Allergies:  Allergies  Allergen Reactions  . Lipitor [Atorvastatin] Other (See Comments)    Muscle aches  . Food Allergy Formula Rash  . Hydrocodone Rash    Family History: Family History  Problem Relation Age of Onset  . Diabetes Mother   . Hypertension Mother   . Hypertension Sister   . Heart disease Brother   . Hypertension Brother     Social History: History   Social History  . Marital Status: Single    Spouse Name: N/A    Number of Children: N/A  . Years of Education: N/A   Occupational History  . Not on file.   Social History Main Topics  . Smoking status: Never Smoker   . Smokeless  tobacco: Never Used  . Alcohol Use: Yes     Comment: socially  . Drug Use: Yes    Special: Marijuana     Comment: smokes marijuana 2 to 3 days a week  . Sexual Activity: Yes    Birth Control/ Protection: Surgical   Other Topics Concern  . Not on file   Social History Narrative   Not working.  She lives with mother.  She has 4 children.    Review of Systems:  CONSTITUTIONAL: No fevers, chills, night sweats, + weight loss (60lb over 1-2 years).   EYES: No visual changes or eye pain ENT: No hearing changes.  No history of nose bleeds.   RESPIRATORY: No cough, wheezing and shortness of breath.   CARDIOVASCULAR: Negative for chest pain, and palpitations.   GI: Negative for abdominal discomfort, blood in stools or black stools.  No recent change in bowel habits.   GU:  No history of incontinence.   MUSCLOSKELETAL: No history of joint pain or swelling.  No myalgias.   SKIN: Negative for lesions, rash, and itching.   HEMATOLOGY/ONCOLOGY: Negative for prolonged bleeding, bruising easily, and swollen nodes.   ENDOCRINE: Negative for cold or heat intolerance, polydipsia or goiter.   PSYCH:  No depression or anxiety symptoms.    NEURO: As Above.   Vital Signs:  BP 90/60  Pulse 80  Temp(Src) 98.3 F (36.8 C)  Ht 5\' 6"  (1.676 m)  Wt 130 lb (58.968 kg)  BMI 20.99 kg/m2  LMP 02/15/2013  General:  Heavily tattooed, multiple healed excoriations on legs  Neurological Exam: MENTAL STATUS including orientation to time, place, person, recent and remote memory, attention span and concentration, language, and fund of knowledge is fair.  Speech is not dysarthric.  CRANIAL NERVES: II:  No visual field defects.  Unremarkable fundi.   III-IV-VI: Pupils equal round and reactive to light.  Normal conjugate, extra-ocular eye movements in all directions of gaze.  No nystagmus.  No ptosis.  V:  Normal facial sensation.  VII:  Normal facial symmetry and movements.   VIII:  Normal hearing and vestibular function.   IX-X:  Normal palatal movement.   XI:  Normal shoulder shrug and head rotation.   XII:  Normal tongue strength and range of motion, no deviation or fasciculation.  MOTOR:  No atrophy, fasciculations or abnormal movements.  No pronator drift.     Right Upper Extremity:    Left Upper Extremity:    Deltoid  5/5   Deltoid  5/5   Biceps  5/5   Biceps  5/5   Triceps  5/5   Triceps  5/5   Wrist extensors  5/5   Wrist extensors  5/5   Wrist flexors  5/5   Wrist flexors  5/5   Finger extensors  5/5   Finger extensors  5/5   Finger flexors  5/5   Finger flexors  5/5   Dorsal interossei  5/5   Dorsal interossei  5/5   Abductor pollicis  5/5   Abductor pollicis  5/5   Tone (Ashworth scale)  0  Tone (Ashworth scale)  0   Right Lower Extremity:    Left Lower Extremity:    Hip flexors  5/5   Hip flexors  5/5   Hip extensors  5/5   Hip extensors  5/5   Knee flexors  5/5   Knee flexors  5/5   Knee extensors  5/5   Knee extensors  5/5   Dorsiflexors  5/5   Dorsiflexors  5/5   Plantarflexors  5/5   Plantarflexors  5/5   Toe extensors  5/5   Toe extensors  5/5   Toe flexors  5/5   Toe flexors  5/5   Tone (Ashworth  scale)  0  Tone (Ashworth scale)  0   MSRs:  Right                                                                 Left brachioradialis 2+  brachioradialis 2+  biceps 2+  biceps 2+  triceps 2+  triceps 2+  patellar 2+  patellar 2+  ankle jerk 2+  ankle jerk 2+  Hoffman no  Hoffman no  plantar response down  plantar response down    SENSORY:  Normal and symmetric perception of light touch, pinprick, vibration, and proprioception.    COORDINATION/GAIT: Normal finger-to- nose-finger and heel-to-shin.  Intact rapid alternating movements bilaterally.  Able to rise from a chair without using arms.  Gait narrow based and stable. Tandem and stressed gait intact.   DATA: MRI brain 2008: No significant abnormality of the brain. Acute and chronic sinusitis  Component     Latest Ref Rng 03/02/2013  Lamotrigine Lvl     3.0 - 14.0 ug/mL 3.6    IMPRESSION: Ms. Defrain is a 27 year-old female with history of JME and medication noncompliance presenting for evaluation of seizures. It seems that her seizures are well controlled when she is compliant with medication. She does complain of increase weight loss over the past one to 2 years which is likely related to zonisamide.  She is on the very low dose of lamictal and going forward we can optimize this and possible reduce the amount of zonisamide and klonopin she is taking.  Since it is my first time meeting Ms. Richardson Dopp, I would like to better assess seizure frequency and severity prior to making any medication adjustments. For now, I have stressed the importance of medication compliance.   PLAN/RECOMMENDATIONS:  1.  Continue zonisamide 800mg  daily and lamictal 50mg  daily 2.  Continue Klonopin 2mg  BID 3.  Will review records from [facility] to better understand her seizure history 4.  Will check drug levels at her next visit  5.  Seizure precautions discussed including no driving 6.  Return to clinic in 2 months.   The duration of this  appointment visit was 60 minutes of face-to-face time with the patient.  Greater than 50% of this time was spent in counseling, explanation of diagnosis, planning of further management, and coordination of care.   Thank you for allowing me to participate in patient's care.  If I can answer any additional questions, I would be pleased to do so.    Sincerely,    Cariah Salatino K. Allena Katz, DO

## 2013-03-08 ENCOUNTER — Telehealth: Payer: Self-pay | Admitting: Neurology

## 2013-03-08 NOTE — Telephone Encounter (Signed)
Returning call to Dr. Allena Katz / Oneita Kras.

## 2013-03-08 NOTE — Telephone Encounter (Signed)
Called patient back, but there was no answer.  I wanted to verify her workup done at [facility], but this can be done at her next office visit.  Chinedum Vanhouten K. Allena Katz, DO

## 2013-03-09 ENCOUNTER — Telehealth: Payer: Self-pay | Admitting: Neurology

## 2013-03-10 NOTE — Telephone Encounter (Signed)
Several attempts made to contact patient, but there was no answer. Message left on answering machine to leave more detailed message as to the specific questions of concerns she has or schedule a followup visit.  Donika K. Allena Katz, DO

## 2013-05-04 ENCOUNTER — Telehealth: Payer: Self-pay | Admitting: Neurology

## 2013-05-04 ENCOUNTER — Ambulatory Visit: Payer: Medicaid Other | Admitting: Neurology

## 2013-05-04 NOTE — Telephone Encounter (Signed)
Pt no showed today's 2 month follow up w/ Dr. Allena Katz. A no show letter has been sent / Sherri S.

## 2013-05-11 ENCOUNTER — Encounter: Payer: Self-pay | Admitting: Neurology

## 2013-05-11 ENCOUNTER — Ambulatory Visit (INDEPENDENT_AMBULATORY_CARE_PROVIDER_SITE_OTHER): Payer: Medicaid Other | Admitting: Neurology

## 2013-05-11 VITALS — BP 86/52 | HR 64 | Temp 98.1°F | Ht 67.0 in | Wt 136.0 lb

## 2013-05-11 DIAGNOSIS — G40309 Generalized idiopathic epilepsy and epileptic syndromes, not intractable, without status epilepticus: Secondary | ICD-10-CM

## 2013-05-11 DIAGNOSIS — G40B09 Juvenile myoclonic epilepsy, not intractable, without status epilepticus: Secondary | ICD-10-CM

## 2013-05-11 NOTE — Progress Notes (Signed)
Follow-up Visit   Date: 05/11/2013    Barbara Hodges MRN: [id number] DOB: [birth year]   Interim History: Barbara Hodges is a 27 y.o. right-handed African American female with history of juvenile myoclonic epilepsy (dx 2007) presenting for evaluation of seizures.  The patient was accompanied to the clinic by her fiance.   She was incarcerated from end of October - 12/20 for unpaid court fees.  While she was incarcerated, she had one witnessed seizure by her cell mate during the first week of December.  She was having panic attacks because she kept having chest pain and shortness of breath.   She is trying to be better about taking her medications as prescribed.  She is concerned about her weight loss because she has never been this thin.  History of present illness: Seizures began at the age of 9. She was in the bathroom, got out of the shower and woke up in a puddle of blood because she hit her head on the sink. She injured her face and chipped all her teeth. This first seizure occurred in her second trimester of her second pregnancy. The patient has aura which is described as because she hands stars to shake and twitches sometimes. Observers who have witnessed the seizures, describe them a certain sound that she makes "eeee", her arm are extended with flexed wrists and appear tonic, eyes and heads roll back, legs are are really stiff. There is loss of consciousness. There is history of tongue biting and urinary incontinence. Seizure duration is typically 2-3.  The typical frequency of seizures is weekly, but only when she is not taking medications. Seizure triggers include stress, loud noises, and bright lights. Myoclonic jerks are more frequent and can occur every few days.   EEG performed in 2009 demonstrated intermittent high voltage generalized 4-5 Hz polyspike and wave discharges suggestive of generalized epilepsy, likely juvenile myoclonic epilepsy. She was initially placed on Keppra  and then Lamictal, but continued to have poor seizure control following pregnancy. She continued on these 2 AEDs until 2008 but still had breakthrough seizures. Keppra was increased to 750 twice a day and GTCs were reduced 1-2/week. In 2009, she was started on Zonegran and Clonopin which reduced seizures down to 1-2 per month. 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 Her last followup and [facility] was in February 2013 at which time patient was on Zonegran 600 mg daily and Lamictal was restarted. She was also continued on Klonopin.   The longest seizure-free interval has been 47-month up until recently. There is no history status epilepticus. She will cluster at times where seizures will occur 1-3 times, last occuring in 2013.  She has four children (ages 23, 56, 4, 2 - all healthy). Her last pregnancy was complicated because her daughter was apneic at birth but was quickly resusciated. She had seizures during delivery and during pregnancy. She is currently not on OCPs, but reports having her fallopian tubes tied.   She has been seeing Dr. Evelene Croon at [facility] for the past 2-years who increased zonisamide from 600-800mg  daily and lamictal 50mg  daily. Prior notes, it seems that she has a history of medication noncompliance and does not for follow-up in the clinic which has also contributed to increased seizures. Her fianc says that when she takes her medications for seizures are well  controlled but within 2 days of stopping AEDs she did she will have a GTC seizure.   Postictal symptoms include: confused, tired   Possible lateralizing signs by history: none  Birth and Early Development: she was   RISK FACTORS FOR SEIZURES:  1. Head Trauma: Yes, with first seizure; 2. CNS Infections: no; 3. Family History of Seizures: no; 4.  Developmental Delay: none; 5. Febrile Seizures : none; 6. CNS Tumors: no; 7. CNS Vascular Disease: none; 8. Significant Medical History: none.   CURRENT ANTICONVULSANTS:  Lamotrigine 50mg  daily  Zonisamide 800mg  daily   The patient forgets a dose: once per week and has started to use an alarm on her phone  The patient's side effects to the current medications: none   PRIOR ANTICONVULSANT HISTORY: Depakote, dilantin, Keppra, xanax - stopped because she continued to have seziures     Medications:  Current Outpatient Prescriptions on File Prior to Visit  Medication Sig Dispense Refill  . cholecalciferol (VITAMIN D) 1000 UNITS tablet Take 1,000 Units by mouth daily.      . clonazePAM (KLONOPIN) 0.5 MG tablet Take 4 tablets (2 mg total) by mouth 2 (two) times daily as needed (seizures).  24 tablet  0  . clonazePAM (KLONOPIN) 1 MG tablet Take 2 tablets (2 mg total) by mouth 2 (two) times daily.  60 tablet  2  . lamoTRIgine (LAMICTAL) 25 MG tablet Take 2 tablets (50 mg total) by mouth daily.  60 tablet  2  . Multiple Vitamin (MULTIVITAMIN WITH MINERALS) TABS tablet Take 1 tablet by mouth daily.      Marland Kitchen zonisamide (ZONEGRAN) 100 MG capsule Take 8 capsules (800 mg total) by mouth daily.  240 capsule  2   No current facility-administered medications on file prior to visit.    Allergies:  Allergies  Allergen Reactions  . Lipitor [Atorvastatin] Other (See Comments)    Muscle aches  . Food Allergy Formula Rash  . Hydrocodone Rash     Review of Systems:  CONSTITUTIONAL: No fevers, chills, night sweats, +weight loss.   EYES: No visual changes or eye pain ENT: No hearing changes.  No history of nose bleeds.   RESPIRATORY: No cough, wheezing and shortness of breath.   CARDIOVASCULAR: Negative for chest pain, and palpitations.   GI: Negative for abdominal discomfort, blood in stools or black stools.  No recent change in bowel habits.   GU:  No history of incontinence.   MUSCLOSKELETAL: No  history of joint pain or swelling.  No myalgias.   SKIN: Negative for lesions, rash, and itching.   ENDOCRINE: Negative for cold or heat intolerance, polydipsia or goiter.   PSYCH:  No depression ++anxiety symptoms.   NEURO: As Above.   Vital Signs:  BP 86/52  Pulse 64  Temp(Src) 98.1 F (36.7 C)  Ht 5\' 7"  (1.702 m)  Wt 136 lb (61.689 kg)  BMI 21.30 kg/m2  Neurological Exam: MENTAL STATUS including orientation to time, place, person, recent and remote memory, attention span and concentration, language, and fund of knowledge is normal.  Speech is not dysarthric.  CRANIAL NERVES: No visual field defects.  Pupils equal round and reactive to light.  Normal conjugate, extra-ocular eye movements in all directions of gaze.  No nystagmus.  Normal facial symmetry and movements.  Normal palatal movement. Tongue is midline.  Keloid present on upper lip.  MOTOR:  Motor strength is 5/5 throughout.  No atrophy, fasciculations or abnormal movements.  No pronator drift.  Tone is normal.  MSRs: Brisk and symmetric 2+/4 throughout  SENSORY:  Intact  COORDINATION/GAIT: Normal finger-to- nose-finger.  Intact rapid alternating movements bilaterally.   Gait narrow based and stable.   Data: Component     Latest Ref Rng 03/02/2013  Lamotrigine Lvl     3.0 - 14.0 ug/mL 3.6    IMPRESSION: 1.  Juvenile myoclonic epilepsy  - Diagnosed at age of 17.  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  History of medication non-compliance 3.  Panic attacks     PLAN/RECOMMENDATIONS:  1.  Increase lamictal 25mg  each week as follows:    AM   PM  Week 1 50mg    (2 tab)  25mg  (1 tab)  Week 2 50mg  (2 tab)  50mg  (2 tab)  Week 3 75mg  (3 tab)  50mg  (2 tab)  Week 4 75mg  (3 tab)  75mg  (3 tab)  Week 5 100mg  (4 tab)  75mg  (3 tab)  Week 6 100mg    100mg  **While increasing lamictal, patient informed to pay attention to any new rash.  If she develops a rash, STOP lamictal 2.  Check drug levels, CMP, and CBC 3.  Continue taking zonisamide 800mg  (8 tablets) daily 4.  Continue taking clonazepam 2mg  twice daily  5.  Referral to psychologist for anxiety 6.  Telephone number for plastic surgery provided for keloid 7.  Seizure precautions discussed, including no driving 8.  Medication compliance was stressed at length 9.  Return to clinic in 76-months    The duration of this appointment visit was 35 minutes of face-to-face time with the patient.  Greater than 50% of this time was spent in counseling, explanation of diagnosis, planning of further management, and coordination of care.   Thank you for allowing me to participate in patient's care.  If I can answer any additional questions, I would be pleased to do so.    Sincerely,    Laneice Meneely K. Allena Katz, DO

## 2013-05-11 NOTE — Patient Instructions (Addendum)
  1.  Increase lamictal 25mg  each week as follows:    AM   PM  Week 1 50mg    (2 tab)  25mg  (1 tab)  Week 2 50mg  (2 tab)  50mg  (2 tab)  Week 3 75mg  (3 tab)  50mg  (2 tab)  Week 4 75mg  (3 tab)  75mg  (3 tab)  Week 5 100mg  (4 tab)  75mg  (3 tab)  Week 6 100mg    100mg  **While increasing lamictal, pay attention to any new rash.  If you develop a rash, STOP lamictal and call the office.** 2.  Call the office when you are getting ready to start taking lamictal 100mg  twice daily 3.  Continue taking zonisamide 800mg  (8 tablets) daily 4.  Continue taking clonazepam 2mg  twice daily  5.  Strongly encouraged to take medications as prescribed 6.  Return to clinic in 60-months    Instructions regarding seizure disorders:   1. If medication has been prescribed for you to prevent seizures, take it exactly as directed.  Do not stop taking the medicine without talking to your doctor first, even if you have not had a seizure in a long time.   2. Avoid activities in which a seizure would cause danger to yourself or to others.  Don't operate dangerous machinery, swim alone, or climb in high or dangerous places, such as on ladders, roofs, or girders.  Do not drive unless your doctor says you may.  3. Wear an emergency medical identification bracelet with information about your seizure.  If you have a seizure, people around you will know what is wrong and get appropriate help.  4. If you have any warning that you may have a seizure, lay down in a safe place where you can't hurt yourself.    5. Do not drink alcohol or take illegal drugs.  These can make you more susceptible to having seizures.  6. Notify your neurology if you are planning pregnancy or if you become pregnant.  CONTACT YOUR DOCTOR IF: You have any problems that may be related to the medicine you are taking.  Teach your family, close friends, or co-workers what to do if you have a seizure: 1. Stay calm.  Keep the person from falling onto harmful  objects.  Move hard or sharp objects out of the way.  2. Don't force anything into the person's mouth or try to open clenched jaws.  Turn the person on his or her side when the violent movement stops or if he or she is vomiting.  3. When the seizure is over, the person may be confused or drowsy.  Reassure the person that he or she is all right.  Help him or her to relax.  4. Call 911 and bring the patient back to the ED if:      a. The patient doesn't awaken shortly after the seizure      b. The patient has new problems such as difficulty seeing, speaking or moving      c. The patient was injured during the seizure      d. The patient has a temperature over 102 F (39C)      e. The patient vomited and now is having trouble breathing

## 2013-05-12 LAB — COMPREHENSIVE METABOLIC PANEL
ALT: 7 U/L (ref 0–35)
AST: 13 U/L (ref 0–37)
Albumin: 4.4 g/dL (ref 3.5–5.2)
Alkaline Phosphatase: 36 U/L — ABNORMAL LOW (ref 39–117)
BUN: 13 mg/dL (ref 6–23)
CO2: 21 mEq/L (ref 19–32)
Calcium: 9.4 mg/dL (ref 8.4–10.5)
Chloride: 116 mEq/L — ABNORMAL HIGH (ref 96–112)
Creatinine, Ser: 0.8 mg/dL (ref 0.4–1.2)
GFR: 105.97 mL/min (ref >60.00)
Glucose, Bld: 82 mg/dL (ref 70–99)
Potassium: 3.8 mEq/L (ref 3.5–5.1)
Sodium: 142 mEq/L (ref 135–145)
Total Bilirubin: 0.5 mg/dL (ref 0.3–1.2)
Total Protein: 7.3 g/dL (ref 6.0–8.3)

## 2013-05-12 LAB — CBC
HCT: 38.3 % (ref 36.0–46.0)
Hemoglobin: 12.7 g/dL (ref 12.0–15.0)
MCHC: 33.1 g/dL (ref 30.0–36.0)
MCV: 92.7 fl (ref 78.0–100.0)
Platelets: 302 10*3/uL (ref 150.0–400.0)
RBC: 4.14 Mil/uL (ref 3.87–5.11)
RDW: 14.1 % (ref 11.5–14.6)
WBC: 4.3 10*3/uL — ABNORMAL LOW (ref 4.5–10.5)

## 2013-05-20 LAB — ZONISAMIDE LEVEL: Zonisamide: 35.2 ug/mL (ref 10.0–40.0)

## 2013-06-17 ENCOUNTER — Telehealth: Payer: Self-pay | Admitting: Neurology

## 2013-06-18 ENCOUNTER — Telehealth: Payer: Self-pay | Admitting: Neurology

## 2013-06-18 MED ORDER — ZONISAMIDE 100 MG PO CAPS: 800.0000 mg | ORAL_CAPSULE | Freq: Every day | ORAL | Status: DC

## 2013-06-18 MED ORDER — CLONAZEPAM 1 MG PO TABS: 2.0000 mg | ORAL_TABLET | Freq: Two times a day (BID) | ORAL | Status: DC

## 2013-06-18 MED ORDER — LAMOTRIGINE 25 MG PO TABS: 50.0000 mg | ORAL_TABLET | Freq: Every day | ORAL | Status: DC

## 2013-06-18 NOTE — Telephone Encounter (Signed)
Fax was sent to [facility] today patient is aware

## 2013-06-18 NOTE — Telephone Encounter (Signed)
Refills have been faxed for on lamictal, zonisamide, and clonazepam.  Please notify patient.  Thank you.  Donika K. [name], DO

## 2013-07-12 ENCOUNTER — Telehealth: Payer: Self-pay | Admitting: Neurology

## 2013-07-12 ENCOUNTER — Ambulatory Visit: Payer: Medicaid Other | Admitting: Neurology

## 2013-07-12 NOTE — Telephone Encounter (Signed)
Pt no showed 2 month follow up appt w/ Dr. [name]. No show letter mailed to patient / Barbara RaiderSherri

## 2013-07-12 NOTE — Telephone Encounter (Signed)
No additional refills on seizure medications until she has been seen in follow-up.    Barbara K. [name], DO

## 2013-08-24 ENCOUNTER — Telehealth: Payer: Self-pay | Admitting: Neurology

## 2013-08-24 ENCOUNTER — Ambulatory Visit: Payer: Medicaid Other | Admitting: Neurology

## 2013-08-24 NOTE — Telephone Encounter (Signed)
Pt no showed today's 2 month follow up appt w/ Dr. [name]. No show letter mailed to pt / Sherri S.

## 2013-12-04 ENCOUNTER — Emergency Department (HOSPITAL_COMMUNITY)
Admission: EM | Admit: 2013-12-04 | Discharge: 2013-12-04 | Discharge status: Against Medical Advice | Payer: Medicaid Other | Attending: Emergency Medicine | Admitting: Emergency Medicine

## 2013-12-04 ENCOUNTER — Encounter (HOSPITAL_COMMUNITY): Payer: Self-pay | Admitting: Emergency Medicine

## 2013-12-04 ENCOUNTER — Emergency Department (HOSPITAL_COMMUNITY): Payer: Medicaid Other

## 2013-12-04 DIAGNOSIS — IMO0002 Reserved for concepts with insufficient information to code with codable children: Secondary | ICD-10-CM | POA: Diagnosis not present

## 2013-12-04 DIAGNOSIS — F911 Conduct disorder, childhood-onset type: Secondary | ICD-10-CM | POA: Diagnosis not present

## 2013-12-04 DIAGNOSIS — M25539 Pain in unspecified wrist: Secondary | ICD-10-CM | POA: Insufficient documentation

## 2013-12-04 DIAGNOSIS — F410 Panic disorder [episodic paroxysmal anxiety] without agoraphobia: Secondary | ICD-10-CM | POA: Insufficient documentation

## 2013-12-04 DIAGNOSIS — G40909 Epilepsy, unspecified, not intractable, without status epilepticus: Secondary | ICD-10-CM | POA: Diagnosis present

## 2013-12-04 DIAGNOSIS — G40309 Generalized idiopathic epilepsy and epileptic syndromes, not intractable, without status epilepticus: Secondary | ICD-10-CM | POA: Insufficient documentation

## 2013-12-04 DIAGNOSIS — Z79899 Other long term (current) drug therapy: Secondary | ICD-10-CM | POA: Diagnosis not present

## 2013-12-04 DIAGNOSIS — R569 Unspecified convulsions: Secondary | ICD-10-CM

## 2013-12-04 DIAGNOSIS — M25532 Pain in left wrist: Secondary | ICD-10-CM

## 2013-12-04 NOTE — ED Notes (Signed)
Pt is refusing treatment, sts she wants to leave, pt pulled out an IV that was placed by EMS. Consulting civil engineerCharge RN notified.

## 2013-12-04 NOTE — ED Provider Notes (Signed)
Medical screening examination/treatment/procedure(s) were conducted as a shared visit with non-physician practitioner(s) and myself.  I personally evaluated the patient during the encounter.   EKG Interpretation None      Discussion was had with patient and she refuses service at this time.  Concerning for possible left wrist fracture.  She states she will return if she chooses assistance at that time.  Patient is alert and oriented x3.  She is no longer postictal.  This is likely breakthrough seizure.  Patient was calm and cooperative during my examination.  She does state that this time she would like to go into police custody and deal with her current legal issues.  She states she will likely return to the ER later to have her left wrist to evaluate further.  Lyanne CoKevin M Maisa Bedingfield, MD 12/04/13 [phone]

## 2013-12-04 NOTE — ED Notes (Signed)
Per EMS pt was arrested and in police custody when she told officers she is about to have a seizure, they pulled over and pt was observed to have seizure like activity with body shaking, no fall, no tongue injury

## 2013-12-04 NOTE — ED Notes (Signed)
Bed: [id number] Expected date:  Expected time:  Means of arrival:  Comments: ems seizure

## 2013-12-04 NOTE — ED Provider Notes (Signed)
CSN: [id number]     Arrival date & time 12/04/13  1901 History   First MD Initiated Contact with Patient 12/04/13 1910     Chief Complaint  Patient presents with  . Seizures     (Consider location/radiation/quality/duration/timing/severity/associated sxs/prior Treatment) HPI Comments: 28 year old female with a past medical history of juvenile myoclonic epilepsy, generalized headaches, panic attacks and anxiety after having a witnessed seizure. Patient was arrested and in police custody, put into the back of the police car, to the officers she was about to have a seizure, they pulled over and they witnessed a seizure lasting about 2 minutes. Police state she had full body shaking. States she has a history of seizures, however refuses to answer what medication she is on. In her medical record it is noted that she is on Lamictal. Patient complaining of left wrist pain. She is refusing evaluation. Will not answer questions.  Patient is a 28 y.o. female presenting with seizures. The history is provided by the police, the patient and the EMS personnel.  Seizures   Past Medical History  Diagnosis Date  . JME (juvenile myoclonic epilepsy)     Age of 60  . Generalized headaches   . Panic attack   . Anxiety    Past Surgical History  Procedure Laterality Date  . Tubal ligation     Family History  Problem Relation Age of Onset  . Healthy Mother   . Other Father   . Asthma Brother   . Healthy Son   . Healthy Daughter    History  Substance Use Topics  . Smoking status: Never Smoker   . Smokeless tobacco: Never Used  . Alcohol Use: No   OB History   Grav Para Term Preterm Abortions TAB SAB Ect Mult Living   2 1 1  1           Review of Systems  Unable to perform ROS: Other  Neurological: Positive for seizures.      Allergies  Lipitor; Food allergy formula; and Hydrocodone  Home Medications   Prior to Admission medications   Medication Sig Start Date End Date Taking?  Authorizing Provider  cholecalciferol (VITAMIN D) 1000 UNITS tablet Take 1,000 Units by mouth daily.    Historical Provider, MD  clonazePAM (KLONOPIN) 1 MG tablet Take 2 tablets (2 mg total) by mouth 2 (two) times daily. 06/18/13   Donika K Patel, DO  diazepam (DIASTAT ACUDIAL) 20 MG GEL 15 mg.    Historical Provider, MD  folic acid (FOLVITE) 1 MG tablet 1 mg. 06/25/11   Historical Provider, MD  lamoTRIgine (LAMICTAL) 25 MG tablet Take 2 tablets (50 mg total) by mouth daily. 06/18/13   Glendale Chard, DO  Multiple Vitamin (MULTIVITAMIN WITH MINERALS) TABS tablet Take 1 tablet by mouth daily.    Historical Provider, MD  SUMAtriptan (IMITREX) 25 MG tablet 25 mg. 02/12/13   Historical Provider, MD  zonisamide (ZONEGRAN) 100 MG capsule Take 8 capsules (800 mg total) by mouth daily. 06/18/13   Glendale Chard, DO   There were no vitals taken for this visit. Physical Exam  Nursing note and vitals reviewed. Constitutional: She is oriented to person, place, and time. She appears well-developed. No distress.  Unkempt.  HENT:  Head: Normocephalic.  Eyes: Conjunctivae are normal.  Pulmonary/Chest: Effort normal.  Neurological: She is alert and oriented to person, place, and time.  Skin: She is not diaphoretic.  Psychiatric: Her affect is angry. She is agitated.  ED Course  Procedures (including critical care time) Labs Review Labs Reviewed  CBG MONITORING, ED    Imaging Review No results found.   EKG Interpretation None      MDM   Final diagnoses:  Seizure  Left wrist pain   Patient refusing evaluation. Will not allow physical exam. Alert and oriented x3. States she will go to jail, if she is still in pain after she is released, she will come for evaluation. Patient will leave the emergency department in police custody AMA.  Case discussed with attending Dr. Patria Maneampos who also evaluated patient and agrees with plan of care.   Trevor MaceRobyn M Albert, PA-C 12/04/13 Ernestina [id number]

## 2013-12-17 ENCOUNTER — Emergency Department (HOSPITAL_COMMUNITY): Payer: Medicaid Other

## 2013-12-17 ENCOUNTER — Encounter (HOSPITAL_COMMUNITY): Payer: Self-pay | Admitting: Emergency Medicine

## 2013-12-17 ENCOUNTER — Emergency Department (HOSPITAL_COMMUNITY)
Admission: EM | Admit: 2013-12-17 | Discharge: 2013-12-17 | Disposition: A | Discharge status: Home / Self Care | Payer: Medicaid Other | Attending: Emergency Medicine | Admitting: Emergency Medicine

## 2013-12-17 ENCOUNTER — Other Ambulatory Visit: Payer: Self-pay | Admitting: Neurology

## 2013-12-17 DIAGNOSIS — R0789 Other chest pain: Secondary | ICD-10-CM | POA: Diagnosis not present

## 2013-12-17 DIAGNOSIS — Z79899 Other long term (current) drug therapy: Secondary | ICD-10-CM | POA: Diagnosis not present

## 2013-12-17 DIAGNOSIS — R0602 Shortness of breath: Secondary | ICD-10-CM | POA: Insufficient documentation

## 2013-12-17 DIAGNOSIS — G40309 Generalized idiopathic epilepsy and epileptic syndromes, not intractable, without status epilepticus: Secondary | ICD-10-CM | POA: Diagnosis not present

## 2013-12-17 DIAGNOSIS — R002 Palpitations: Secondary | ICD-10-CM | POA: Insufficient documentation

## 2013-12-17 DIAGNOSIS — R5383 Other fatigue: Secondary | ICD-10-CM

## 2013-12-17 DIAGNOSIS — F410 Panic disorder [episodic paroxysmal anxiety] without agoraphobia: Secondary | ICD-10-CM | POA: Diagnosis not present

## 2013-12-17 DIAGNOSIS — F419 Anxiety disorder, unspecified: Secondary | ICD-10-CM

## 2013-12-17 DIAGNOSIS — R5381 Other malaise: Secondary | ICD-10-CM | POA: Diagnosis not present

## 2013-12-17 DIAGNOSIS — R079 Chest pain, unspecified: Secondary | ICD-10-CM | POA: Diagnosis present

## 2013-12-17 DIAGNOSIS — R42 Dizziness and giddiness: Secondary | ICD-10-CM | POA: Diagnosis not present

## 2013-12-17 LAB — CBC
HCT: 37.1 % (ref 36.0–46.0)
HEMOGLOBIN: 12.7 g/dL (ref 12.0–15.0)
MCH: 30.4 pg (ref 26.0–34.0)
MCHC: 34.2 g/dL (ref 30.0–36.0)
MCV: 88.8 fL (ref 78.0–100.0)
PLATELETS: 382 10*3/uL (ref 150–400)
RBC: 4.18 MIL/uL (ref 3.87–5.11)
RDW: 13.2 % (ref 11.5–15.5)
WBC: 5.3 10*3/uL (ref 4.0–10.5)

## 2013-12-17 LAB — BASIC METABOLIC PANEL
Anion gap: 13 (ref 5–15)
BUN: 11 mg/dL (ref 6–23)
CALCIUM: 9.4 mg/dL (ref 8.4–10.5)
CO2: 19 mEq/L (ref 19–32)
Chloride: 108 mEq/L (ref 96–112)
Creatinine, Ser: 0.95 mg/dL (ref 0.50–1.10)
GFR calc Af Amer: >90 mL/min (ref >90)
GFR, EST NON AFRICAN AMERICAN: 83 mL/min — AB (ref >90)
Glucose, Bld: 88 mg/dL (ref 70–99)
Potassium: 3.9 mEq/L (ref 3.7–5.3)
SODIUM: 140 meq/L (ref 137–147)

## 2013-12-17 LAB — I-STAT TROPONIN, ED: TROPONIN I, POC: 0 ng/mL (ref 0.00–0.08)

## 2013-12-17 MED ORDER — LORAZEPAM 0.5 MG PO TABS: 0.5000 mg | ORAL_TABLET | Freq: Three times a day (TID) | ORAL | Status: DC | PRN

## 2013-12-17 MED ORDER — LAMOTRIGINE 25 MG PO TABS: 50.0000 mg | ORAL_TABLET | Freq: Every day | ORAL | Status: DC

## 2013-12-17 NOTE — ED Notes (Signed)
PA student at bedside.

## 2013-12-17 NOTE — ED Notes (Signed)
Per EMS: Pt from home for eval of left sided cp that started this morning, pt states when she could not find her mother to help her cp increased and pt states she became anxious, pt with hx of same anxiety. Pt normally takes Klonopin for anxiety but reports that while incarcerated x2 weeks ago her medications were taken and has not been on them since. Pt denies any sob/n/v/d. EMS reports upon arrival pt was shaking and cool to touch. CBG 80. Pt axox 4, nad noted.

## 2013-12-17 NOTE — ED Notes (Signed)
Pt continues to be monitored by 12 lead, blood pressure, and pulse ox.  

## 2013-12-17 NOTE — ED Notes (Signed)
PA Josh at bedside. 

## 2013-12-17 NOTE — Discharge Instructions (Signed)
Please read and follow all provided instructions.  Your diagnoses today include:  1. Other chest pain   2. Anxiety     Tests performed today include:  An EKG of your heart  A chest x-ray  Cardiac enzymes - a blood test for heart muscle damage, no signs of heart problems  Blood counts and electrolytes  Vital signs. See below for your results today.   Medications prescribed:   Valium - muscle relaxer medication  DO NOT drive or perform any activities that require you to be awake and alert because this medicine can make you drowsy.    Lamictal - medication for seizure  Take any prescribed medications only as directed.  Follow-up instructions: Please follow-up with your primary care provider as soon as you can for further evaluation of your symptoms.   Return instructions:  SEEK IMMEDIATE MEDICAL ATTENTION IF:  You have severe chest pain, especially if the pain is crushing or pressure-like and spreads to the arms, back, neck, or jaw, or if you have sweating, nausea (feeling sick to your stomach), or shortness of breath. THIS IS AN EMERGENCY. Don't wait to see if the pain will go away. Get medical help at once. Call 911 or 0 (operator). DO NOT drive yourself to the hospital.   Your chest pain gets worse and does not go away with rest.   You have an attack of chest pain lasting longer than usual, despite rest and treatment with the medications your caregiver has prescribed.   You wake from sleep with chest pain or shortness of breath.  You feel dizzy or faint.  You have chest pain not typical of your usual pain for which you originally saw your caregiver.   You have any other emergent concerns regarding your health.  Additional Information: Chest pain comes from many different causes. Your caregiver has diagnosed you as having chest pain that is not specific for one problem, but does not require admission.  You are at low risk for an acute heart condition or other serious  illness.   Your vital signs today were: BP 106/71   Pulse 68   Temp(Src) 98.4 F (36.9 C) (Oral)   Resp 19   SpO2 100%   LMP 12/09/2013 If your blood pressure (BP) was elevated above 135/85 this visit, please have this repeated by your doctor within one month. --------------

## 2013-12-17 NOTE — Telephone Encounter (Signed)
Last seen 04-2013.  She was seen at the ED today.

## 2013-12-17 NOTE — ED Provider Notes (Signed)
Medical screening examination/treatment/procedure(s) were performed by non-physician practitioner and as supervising physician I was immediately available for consultation/collaboration.   EKG Interpretation   Date/Time:  Friday December 17 2013 13:09:29 EDT Ventricular Rate:  68 PR Interval:  197 QRS Duration: 96 QT Interval:  413 QTC Calculation: 439 R Axis:   94 Text Interpretation:  Sinus rhythm Borderline right axis deviation  Borderline T wave abnormalities No prior for comparison Confirmed by  Malaiya Paczkowski  MD, Aalyah Mansouri ([id number]) on 12/17/2013 1:25:06 PM        Shanna CiscoMegan E Marissa Lowrey, MD 12/17/13 2017

## 2013-12-17 NOTE — ED Provider Notes (Signed)
CSN: [id number]     Arrival date & time 12/17/13  1300 History   First MD Initiated Contact with Patient 12/17/13 1311     Chief Complaint  Patient presents with  . Chest Pain  . Anxiety     (Consider location/radiation/quality/duration/timing/severity/associated sxs/prior Treatment) HPI Comments: Patient with history of seizure disorder, anxiety, recent incarceration -- presents with complaint of chest pain, palpitations, shortness of breath, lightheadedness that began when she awoke this morning. Patient was very anxious. She states that she was unable to talk. She states that she had to crawl on the floor to get to the phone and called 9-1-1. No full syncope. She states that these symptoms are not like her usual seizures which are tonic-clonic in nature with consciousness. Patient states that she has not taken her Lamictal or Klonopin since being incarcerated 2 weeks ago. Symptoms improved after 911 arrived without treatment. CBG was normal upon EMS arrival. No history of cardiac or pulmonary issues. No history of stroke, sickle cell. Patient denies risk factors for pulmonary embolism including: unilateral leg swelling, history of DVT/PE/other blood clots, use of estrogens, recent immobilizations, recent surgery, recent travel (>4hr segment), malignancy, hemoptysis.   The onset of this condition was acute. The course is constant. Aggravating factors: none. Alleviating factors: none.    The history is provided by the patient and medical records.    Past Medical History  Diagnosis Date  . JME (juvenile myoclonic epilepsy)     Age of 28  . Generalized headaches   . Panic attack   . Anxiety    Past Surgical History  Procedure Laterality Date  . Tubal ligation     Family History  Problem Relation Age of Onset  . Healthy Mother   . Other Father   . Asthma Brother   . Healthy Son   . Healthy Daughter    History  Substance Use Topics  . Smoking status: Never Smoker   . Smokeless  tobacco: Never Used  . Alcohol Use: No   OB History   Grav Para Term Preterm Abortions TAB SAB Ect Mult Living   2 1 1  1           Review of Systems  Constitutional: Negative for fever and diaphoresis.  Eyes: Negative for redness.  Respiratory: Positive for shortness of breath. Negative for cough.   Cardiovascular: Positive for chest pain and palpitations. Negative for leg swelling.  Gastrointestinal: Negative for nausea, vomiting and abdominal pain.  Genitourinary: Negative for dysuria.  Musculoskeletal: Negative for back pain and neck pain.  Skin: Negative for rash.  Neurological: Positive for weakness and light-headedness. Negative for syncope.      Allergies  Lipitor; Food allergy formula; and Hydrocodone  Home Medications   Prior to Admission medications   Medication Sig Start Date End Date Taking? Authorizing Provider  clonazePAM (KLONOPIN) 1 MG tablet Take 2 tablets (2 mg total) by mouth 2 (two) times daily. 06/18/13  Yes Donika Concha SeK Patel, DO  lamoTRIgine (LAMICTAL) 25 MG tablet Take 2 tablets (50 mg total) by mouth daily. 06/18/13  Yes Donika K Patel, DO  zonisamide (ZONEGRAN) 100 MG capsule Take 8 capsules (800 mg total) by mouth daily. 06/18/13  Yes Donika K Patel, DO   BP 106/71  Pulse 68  Temp(Src) 98.4 F (36.9 C) (Oral)  Resp 19  SpO2 100%  LMP 12/09/2013 Physical Exam  Nursing note and vitals reviewed. Constitutional: She is oriented to person, place, and time. She appears well-developed and  well-nourished.  HENT:  Head: Normocephalic and atraumatic.  Right Ear: Tympanic membrane, external ear and ear canal normal.  Left Ear: Tympanic membrane, external ear and ear canal normal.  Nose: Nose normal.  Mouth/Throat: Uvula is midline, oropharynx is clear and moist and mucous membranes are normal. Mucous membranes are not dry.  Eyes: Conjunctivae, EOM and lids are normal. Pupils are equal, round, and reactive to light. Right eye exhibits no discharge. Left eye  exhibits no discharge. Right eye exhibits no nystagmus. Left eye exhibits no nystagmus.  Neck: Trachea normal and normal range of motion. Neck supple. Normal carotid pulses and no JVD present. No muscular tenderness present. Carotid bruit is not present. No tracheal deviation present.  Cardiovascular: Normal rate, regular rhythm, S1 normal, S2 normal, normal heart sounds and intact distal pulses.  Exam reveals no decreased pulses.   No murmur heard. Pulmonary/Chest: Effort normal and breath sounds normal. No respiratory distress. She has no wheezes. She exhibits no tenderness.  Abdominal: Soft. Normal aorta and bowel sounds are normal. There is no tenderness. There is no rebound and no guarding.  Musculoskeletal: Normal range of motion.       Cervical back: She exhibits normal range of motion, no tenderness and no bony tenderness.  Neurological: She is alert and oriented to person, place, and time. She has normal strength and normal reflexes. No cranial nerve deficit or sensory deficit. She displays a negative Romberg sign. Coordination and gait normal. GCS eye subscore is 4. GCS verbal subscore is 5. GCS motor subscore is 6.  Skin: Skin is warm and dry. She is not diaphoretic. No cyanosis. No pallor.  Psychiatric: She has a normal mood and affect.    ED Course  Procedures (including critical care time) Labs Review Labs Reviewed  BASIC METABOLIC PANEL - Abnormal; Notable for the following:    GFR calc non Af Amer 83 (*)    All other components within normal limits  CBC  I-STAT TROPOININ, ED    Imaging Review Dg Chest Port 1 View  12/17/2013   CLINICAL DATA:  Chest pain  EXAM: PORTABLE CHEST - 1 VIEW  COMPARISON:  None.  FINDINGS: The heart size and mediastinal contours are within normal limits. Both lungs are clear. The visualized skeletal structures are unremarkable.  IMPRESSION: No active disease.   Electronically Signed   By: Elige Ko   On: 12/17/2013 14:13     EKG  Interpretation   Date/Time:  Friday December 17 2013 13:09:29 EDT Ventricular Rate:  68 PR Interval:  197 QRS Duration: 96 QT Interval:  413 QTC Calculation: 439 R Axis:   94 Text Interpretation:  Sinus rhythm Borderline right axis deviation  Borderline T wave abnormalities No prior for comparison Confirmed by  DOCHERTY  MD, MEGAN ([id number]) on 12/17/2013 1:25:06 PM      Patient seen and examined. Work-up initiated. Medications ordered.   Vital signs reviewed and are as follows: Filed Vitals:   12/17/13 1533  BP: 106/70  Pulse: 65  Temp: 98.6 F (37 C)  Resp: 19   Patient symptoms are resolved at time of exam. Patient informed of results.   I will provide short-term refill for Lamictal and give #10 0.5mg  ativan to use as needed for recurrent symptoms. Patient is to follow up with her primary care physician as soon as possible for recheck and reevaluation as well as medication refill.  Patient was counseled to return with severe chest pain, especially if the pain is crushing or  pressure-like and spreads to the arms, back, neck, or jaw, or if they have sweating, nausea, or shortness of breath with the pain. They were encouraged to call 911 with these symptoms.   They were also told to return if their chest pain gets worse and does not go away with rest, they have an attack of chest pain lasting longer than usual despite rest and treatment with the medications their caregiver has prescribed, if they wake from sleep with chest pain or shortness of breath, if they feel dizzy or faint, if they have chest pain not typical of their usual pain, or if they have any other emergent concerns regarding their health.  Patient was also counseled on neuro symptoms that should indicate their return to the ED.  These include severe worsening headache, vision changes, confusion, loss of consciousness, trouble walking, nausea & vomiting, or weakness/tingling in extremities.    MDM   Final diagnoses:  Other  chest pain  Anxiety   Patient with constellation of symptoms consistent with probable panic attack. EKG, troponin, labs here are unremarkable. Patient has complete resolution of all of her symptoms. She does not have any risk factors for stroke, TIA, MI or ACS. Do not feel that admission or further workup is indicated for these. Patient is PERC negative. I doubt breakthrough seizure. Patient improved and wants to go home. Treatment as above.   No dangerous or life-threatening conditions suspected or identified by history, physical exam, and by work-up. No indications for hospitalization identified.      Renne Crigler, PA-C 12/17/13 [phone]

## 2013-12-20 ENCOUNTER — Other Ambulatory Visit: Payer: Self-pay | Admitting: *Deleted

## 2013-12-20 ENCOUNTER — Encounter: Payer: Self-pay | Admitting: *Deleted

## 2013-12-21 ENCOUNTER — Other Ambulatory Visit: Payer: Self-pay | Admitting: *Deleted

## 2013-12-21 NOTE — Telephone Encounter (Signed)
Patient needs to make follow-up appointment for additional refills, last seen in 04/2013.    Isbella Arline K. [name], DO

## 2013-12-21 NOTE — Telephone Encounter (Signed)
I can't find your answer.  I think you said give #15.

## 2014-01-18 ENCOUNTER — Emergency Department (HOSPITAL_COMMUNITY)
Admission: EM | Admit: 2014-01-18 | Discharge: 2014-01-18 | Disposition: A | Discharge status: Home / Self Care | Payer: Medicaid Other | Attending: Emergency Medicine | Admitting: Emergency Medicine

## 2014-01-18 ENCOUNTER — Encounter (HOSPITAL_COMMUNITY): Payer: Self-pay | Admitting: Emergency Medicine

## 2014-01-18 DIAGNOSIS — G40909 Epilepsy, unspecified, not intractable, without status epilepticus: Secondary | ICD-10-CM | POA: Diagnosis not present

## 2014-01-18 DIAGNOSIS — R569 Unspecified convulsions: Secondary | ICD-10-CM | POA: Insufficient documentation

## 2014-01-18 DIAGNOSIS — F410 Panic disorder [episodic paroxysmal anxiety] without agoraphobia: Secondary | ICD-10-CM | POA: Diagnosis not present

## 2014-01-18 DIAGNOSIS — Z79899 Other long term (current) drug therapy: Secondary | ICD-10-CM | POA: Insufficient documentation

## 2014-01-18 DIAGNOSIS — Z3202 Encounter for pregnancy test, result negative: Secondary | ICD-10-CM | POA: Diagnosis not present

## 2014-01-18 LAB — POC URINE PREG, ED: Preg Test, Ur: NEGATIVE

## 2014-01-18 MED ORDER — LAMOTRIGINE 25 MG PO TABS
25.0000 mg | ORAL_TABLET | ORAL | Status: AC
  Administered 2014-01-18: 25 mg via ORAL
  Filled 2014-01-18: qty 1

## 2014-01-18 MED ORDER — CLONAZEPAM 1 MG PO TABS: 1.0000 mg | ORAL_TABLET | Freq: Two times a day (BID) | ORAL | Status: DC

## 2014-01-18 MED ORDER — ZONISAMIDE 100 MG PO CAPS
800.0000 mg | ORAL_CAPSULE | Freq: Every day | ORAL | Status: DC
  Administered 2014-01-18: 800 mg via ORAL
  Filled 2014-01-18: qty 8

## 2014-01-18 MED ORDER — LAMOTRIGINE 25 MG PO TABS
25.0000 mg | ORAL_TABLET | Freq: Two times a day (BID) | ORAL | Status: DC
  Filled 2014-01-18 (×2): qty 1

## 2014-01-18 MED ORDER — ZONISAMIDE 100 MG PO CAPS: 800.0000 mg | ORAL_CAPSULE | Freq: Every day | ORAL | Status: DC

## 2014-01-18 MED ORDER — OXYCODONE-ACETAMINOPHEN 5-325 MG PO TABS: 1.0000 | ORAL_TABLET | ORAL | Status: DC | PRN

## 2014-01-18 MED ORDER — LAMOTRIGINE 25 MG PO TABS: 25.0000 mg | ORAL_TABLET | Freq: Two times a day (BID) | ORAL | Status: DC

## 2014-01-18 MED ORDER — CLONAZEPAM 0.5 MG PO TABS
1.0000 mg | ORAL_TABLET | Freq: Two times a day (BID) | ORAL | Status: DC
  Administered 2014-01-18: 1 mg via ORAL
  Filled 2014-01-18: qty 2

## 2014-01-18 MED ORDER — OXYCODONE-ACETAMINOPHEN 5-325 MG PO TABS
1.0000 | ORAL_TABLET | Freq: Once | ORAL | Status: AC
  Administered 2014-01-18: 1 via ORAL
  Filled 2014-01-18: qty 1

## 2014-01-18 NOTE — ED Provider Notes (Signed)
CSN: [id number]     Arrival date & time 01/18/14  0849 History   First MD Initiated Contact with Patient 01/18/14 [phone]     Chief Complaint  Patient presents with  . Seizures  . Headache      HPI Patient is brought to the emergency department today for her seizure.  This was a witnessed seizure by her family.  Typical seizure for the patient.  Patient is a history of epilepsy.  She is on Lamictal, Klonopin, Zonisamide.  She states she's been out of his medications for several days.  She has a history of juvenile myoclonic epilepsy.  No recent fevers.  No complaints of altered mental status.  No headache.  No weakness of her arms or legs.  No significant trauma from her seizure today.  Patient is returned back to baseline mental status per the family.   Past Medical History  Diagnosis Date  . JME (juvenile myoclonic epilepsy)     Age of 74  . Generalized headaches   . Panic attack   . Anxiety    Past Surgical History  Procedure Laterality Date  . Tubal ligation     Family History  Problem Relation Age of Onset  . Healthy Mother   . Other Father   . Asthma Brother   . Healthy Son   . Healthy Daughter    History  Substance Use Topics  . Smoking status: Never Smoker   . Smokeless tobacco: Never Used  . Alcohol Use: No   OB History   Grav Para Term Preterm Abortions TAB SAB Ect Mult Living   Review of Systems  All other systems reviewed and are negative.     Allergies  Lipitor; Food allergy formula; and Hydrocodone  Home Medications   Prior to Admission medications   Medication Sig Start Date End Date Taking? Authorizing Provider  LORazepam (ATIVAN) 0.5 MG tablet Take 1 tablet (0.5 mg total) by mouth 3 (three) times daily as needed for anxiety. 12/17/13  Yes Renne Crigler, PA-C  clonazePAM (KLONOPIN) 1 MG tablet Take 1 tablet (1 mg total) by mouth 2 (two) times daily. 01/18/14   Lyanne Co, MD  lamoTRIgine (LAMICTAL) 25 MG tablet Take 1 tablet  (25 mg total) by mouth 2 (two) times daily. 01/18/14   Lyanne Co, MD  zonisamide (ZONEGRAN) 100 MG capsule Take 8 capsules (800 mg total) by mouth daily. 01/18/14   Lyanne Co, MD   BP 95/60  Pulse 60  Temp(Src) 98.6 F (37 C) (Oral)  Resp 18  SpO2 100% Physical Exam  Nursing note and vitals reviewed. Constitutional: She is oriented to person, place, and time. She appears well-developed and well-nourished. No distress.  HENT:  Head: Normocephalic and atraumatic.  Eyes: EOM are normal. Pupils are equal, round, and reactive to light.  Neck: Normal range of motion.  Cardiovascular: Normal rate, regular rhythm and normal heart sounds.   Pulmonary/Chest: Effort normal and breath sounds normal.  Abdominal: Soft. She exhibits no distension. There is no tenderness.  Musculoskeletal: Normal range of motion.  Neurological: She is alert and oriented to person, place, and time.  5/5 strength in major muscle groups of  bilateral upper and lower extremities. Speech normal. No facial asymetry.   Skin: Skin is warm and dry.  Psychiatric: She has a normal mood and affect. Judgment normal.    ED Course  Procedures (including critical  care time) Labs Review Labs Reviewed  POC URINE PREG, ED    Imaging Review No results found.   EKG Interpretation None      MDM   Final diagnoses:  Seizure    Likely breakthrough seizure.  Noncompliance of medication.  Patient was given her home medications and observed in the ER.  She's had no more seizure activity.  She be discharged home on her normal home medications.  I have for further back to her neurologist.    Lyanne Co, MD 01/18/14 [phone]

## 2014-01-18 NOTE — ED Notes (Signed)
Pt states her BP normally runs in the 90's systolic.

## 2014-01-18 NOTE — ED Notes (Addendum)
Pt presents to department for evaluation of seizure and headache. Husband witnessed x1 seizure this morning. Pt states she recently ran out of seizure medication. Pt is lethargic and sleepy upon arrival to ED. Respirations unlabored.

## 2014-01-18 NOTE — ED Notes (Signed)
Pt lying on opposite side and arm with BP cuff up. Will continue to recheck BP and will recheck BP when pt wakes up with her on her back. MD made aware. No further orders received.

## 2014-01-18 NOTE — ED Notes (Signed)
Dr. Campos at the bedside.  

## 2014-01-18 NOTE — Discharge Instructions (Signed)

## 2014-02-14 ENCOUNTER — Ambulatory Visit: Payer: Medicaid Other | Admitting: Neurology

## 2014-02-16 ENCOUNTER — Encounter: Payer: Self-pay | Admitting: Neurology

## 2014-02-16 ENCOUNTER — Telehealth: Payer: Self-pay | Admitting: Neurology

## 2014-02-16 NOTE — Telephone Encounter (Signed)
Message copied by Griffin DakinSANDS, SHERRI R on Wed Feb 16, 2014  9:38 AM ------      Message from: Nita SicklePATEL, DONIKA K      Created: Mon Feb 14, 2014  9:39 AM      Regarding: FW: Pt would like to r/s       Contact: [phone]       Sherri,            Patient has missed too many f/u appointments and calling us just for refills on medications.  I feel like I have given her multiple chances to see us in the clinic, but she has not showed.  Last seen in clinic on 05/11/2013.  Please allow only one f/u visit and send certified letter informing patient that she will be discharged from our practice, if she fails to follow-up.            Thanks,            Roxana Hiresonika             ----- Message -----         From: Shary DecampAndrea S Loye         Sent: 02/14/2014   8:45 AM           To: [place] Chardonika K Patel, DO      Subject: Pt would like to r/s                                     Pt called at 8:48AM wanting to know what time her appt was for today 02/14/14. I informed the pt that it was at 7:45AM and she stated she thought it was at 8AM (She sounds like she was out of it). I explained to her that we called and LMOM twice to remind her of her appt.      Pt would like to r/s.              ------

## 2014-02-16 NOTE — Telephone Encounter (Signed)
Letter mailed to pt advising of our policy. We have also verbally advised the pt of this when she called to r/s. / Sherri S.

## 2014-03-16 ENCOUNTER — Encounter (HOSPITAL_COMMUNITY): Payer: Self-pay | Admitting: Emergency Medicine

## 2014-03-16 ENCOUNTER — Emergency Department (HOSPITAL_COMMUNITY)
Admission: EM | Admit: 2014-03-16 | Discharge: 2014-03-16 | Disposition: A | Discharge status: Home / Self Care | Payer: Medicaid Other | Attending: Emergency Medicine | Admitting: Emergency Medicine

## 2014-03-16 DIAGNOSIS — Z79899 Other long term (current) drug therapy: Secondary | ICD-10-CM | POA: Insufficient documentation

## 2014-03-16 DIAGNOSIS — X58XXXA Exposure to other specified factors, initial encounter: Secondary | ICD-10-CM | POA: Diagnosis not present

## 2014-03-16 DIAGNOSIS — F410 Panic disorder [episodic paroxysmal anxiety] without agoraphobia: Secondary | ICD-10-CM | POA: Diagnosis not present

## 2014-03-16 DIAGNOSIS — Z3202 Encounter for pregnancy test, result negative: Secondary | ICD-10-CM | POA: Insufficient documentation

## 2014-03-16 DIAGNOSIS — S01511A Laceration without foreign body of lip, initial encounter: Secondary | ICD-10-CM | POA: Diagnosis not present

## 2014-03-16 DIAGNOSIS — R569 Unspecified convulsions: Secondary | ICD-10-CM

## 2014-03-16 DIAGNOSIS — G40909 Epilepsy, unspecified, not intractable, without status epilepticus: Secondary | ICD-10-CM | POA: Diagnosis not present

## 2014-03-16 DIAGNOSIS — Y929 Unspecified place or not applicable: Secondary | ICD-10-CM | POA: Insufficient documentation

## 2014-03-16 DIAGNOSIS — R109 Unspecified abdominal pain: Secondary | ICD-10-CM | POA: Insufficient documentation

## 2014-03-16 DIAGNOSIS — Y939 Activity, unspecified: Secondary | ICD-10-CM | POA: Diagnosis not present

## 2014-03-16 LAB — URINALYSIS, ROUTINE W REFLEX MICROSCOPIC
Bilirubin Urine: NEGATIVE
Glucose, UA: NEGATIVE mg/dL
HGB URINE DIPSTICK: NEGATIVE
Ketones, ur: NEGATIVE mg/dL
Leukocytes, UA: NEGATIVE
Nitrite: POSITIVE — AB
PH: 6 (ref 5.0–8.0)
Protein, ur: NEGATIVE mg/dL
SPECIFIC GRAVITY, URINE: 1.028 (ref 1.005–1.030)
UROBILINOGEN UA: 1 mg/dL (ref 0.0–1.0)

## 2014-03-16 LAB — URINE MICROSCOPIC-ADD ON

## 2014-03-16 LAB — POC URINE PREG, ED: PREG TEST UR: NEGATIVE

## 2014-03-16 MED ORDER — LAMOTRIGINE 25 MG PO TABS
25.0000 mg | ORAL_TABLET | Freq: Two times a day (BID) | ORAL | Status: DC
Start: 2014-03-16 — End: 2014-03-31

## 2014-03-16 MED ORDER — ASPIRIN-ACETAMINOPHEN-CAFFEINE 250-250-65 MG PO TABS
1.0000 | ORAL_TABLET | Freq: Once | ORAL | Status: DC
  Filled 2014-03-16: qty 1

## 2014-03-16 MED ORDER — DEXTROSE 5 % IV SOLN
1.0000 g | Freq: Once | INTRAVENOUS | Status: AC
  Administered 2014-03-16: 1 g via INTRAVENOUS
  Filled 2014-03-16: qty 10

## 2014-03-16 MED ORDER — ZONISAMIDE 100 MG PO CAPS
800.0000 mg | ORAL_CAPSULE | Freq: Once | ORAL | Status: AC
  Administered 2014-03-16: 800 mg via ORAL
  Filled 2014-03-16: qty 8

## 2014-03-16 MED ORDER — CEPHALEXIN 500 MG PO CAPS: 500.0000 mg | ORAL_CAPSULE | Freq: Two times a day (BID) | ORAL | Status: DC

## 2014-03-16 MED ORDER — LIDOCAINE VISCOUS 2 % MT SOLN
15.0000 mL | Freq: Once | OROMUCOSAL | Status: AC
  Administered 2014-03-16: 15 mL via OROMUCOSAL
  Filled 2014-03-16: qty 15

## 2014-03-16 MED ORDER — MAGIC MOUTHWASH W/LIDOCAINE: 5.0000 mL | Freq: Three times a day (TID) | ORAL | Status: DC

## 2014-03-16 MED ORDER — ACETAMINOPHEN 325 MG PO TABS
650.0000 mg | ORAL_TABLET | Freq: Once | ORAL | Status: DC
  Filled 2014-03-16: qty 2

## 2014-03-16 MED ORDER — ZONISAMIDE 100 MG PO CAPS: 800.0000 mg | ORAL_CAPSULE | Freq: Every day | ORAL | Status: DC

## 2014-03-16 MED ORDER — OXYCODONE-ACETAMINOPHEN 5-325 MG PO TABS
1.0000 | ORAL_TABLET | Freq: Once | ORAL | Status: AC
  Administered 2014-03-16: 1 via ORAL
  Filled 2014-03-16: qty 1

## 2014-03-16 MED ORDER — LAMOTRIGINE 25 MG PO TABS
25.0000 mg | ORAL_TABLET | Freq: Once | ORAL | Status: AC
  Administered 2014-03-16: 25 mg via ORAL
  Filled 2014-03-16: qty 1

## 2014-03-16 MED ORDER — BUTALBITAL-APAP-CAFFEINE 50-325-40 MG PO TABS: 1.0000 | ORAL_TABLET | Freq: Once | ORAL | Status: DC

## 2014-03-16 NOTE — ED Notes (Signed)
[facility] when she had a witnessed seizure lasting appx 1 minute. Per bystanders, pt was sitting in chair and fell forward striking face. Pt noted to have cuts to lip and broken tooth. Urinary incontinence. Pt has hx of seizure, reports taking Clonopin for seizures. States she hasn't taken medications in last three days. Pt is now AO x4. 106/70. 90 reg. CBG 78.

## 2014-03-16 NOTE — ED Provider Notes (Signed)
CSN: [id number]     Arrival date & time 03/16/14  1718 History   First MD Initiated Contact with Patient 03/16/14 1725     Chief Complaint  Patient presents with  . Seizures    Patient is a 28 y.o. female presenting with seizures. The history is provided by the patient and a relative.  Seizures Seizure activity on arrival: no   Seizure type:  Grand mal Initial focality:  Diffuse Episode characteristics: abnormal movements, incontinence (urinary) and unresponsiveness   Episode characteristics comment:  Lip biting Postictal symptoms: somnolence   Return to baseline: yes   Severity:  Moderate Duration:  2 minutes Timing:  Once Number of seizures this episode:  1 Progression:  Unchanged Context: medical non-compliance   Context comment:  Ran out of Lamictal, Zonegran, Klonopin 4 days ago Recent head injury:  No recent head injuries PTA treatment:  None History of seizures: yes     Past Medical History  Diagnosis Date  . JME (juvenile myoclonic epilepsy)     Age of 28  . Generalized headaches   . Panic attack   . Anxiety    Past Surgical History  Procedure Laterality Date  . Tubal ligation     Family History  Problem Relation Age of Onset  . Healthy Mother   . Other Father   . Asthma Brother   . Healthy Son   . Healthy Daughter    History  Substance Use Topics  . Smoking status: Never Smoker   . Smokeless tobacco: Never Used  . Alcohol Use: No   OB History   Grav Para Term Preterm Abortions TAB SAB Ect Mult Living   2 1 1  1           Review of Systems  Constitutional: Negative for fever and chills.  Gastrointestinal: Positive for abdominal pain. Negative for nausea and vomiting.  Genitourinary: Positive for dysuria ("I think I have a UTI").  Neurological: Positive for seizures and headaches.  All other systems reviewed and are negative.   Allergies  Lipitor; Food allergy formula; and Hydrocodone  Home Medications   Prior to Admission medications     Medication Sig Start Date End Date Taking? Authorizing Provider  clonazePAM (KLONOPIN) 1 MG tablet Take 1 tablet (1 mg total) by mouth 2 (two) times daily. 01/18/14   Lyanne CoKevin M Campos, MD  lamoTRIgine (LAMICTAL) 25 MG tablet Take 1 tablet (25 mg total) by mouth 2 (two) times daily. 01/18/14   Lyanne CoKevin M Campos, MD  LORazepam (ATIVAN) 0.5 MG tablet Take 1 tablet (0.5 mg total) by mouth 3 (three) times daily as needed for anxiety. 12/17/13   Renne CriglerJoshua Geiple, PA-C  oxyCODONE-acetaminophen (PERCOCET/ROXICET) 5-325 MG per tablet Take 1 tablet by mouth every 4 (four) hours as needed for severe pain. 01/18/14   Lyanne CoKevin M Campos, MD  zonisamide (ZONEGRAN) 100 MG capsule Take 8 capsules (800 mg total) by mouth daily. 01/18/14   Lyanne CoKevin M Campos, MD   BP 98/64  Pulse 79  Temp(Src) 98.8 F (37.1 C)  Resp 14  SpO2 100%  Physical Exam  Constitutional: She is oriented to person, place, and time. She appears well-developed and well-nourished. She is cooperative. No distress.  Thin, young, female sitting up in bed  HENT:  Head: Normocephalic.  Right Ear: External ear normal.  Left Ear: External ear normal.  Mouth/Throat:    0.5 cm superficial laceration on upper inner lip, bleeding controlled; poor dentition  Neck: Normal range of motion and phonation  normal.  Cardiovascular: Normal rate and regular rhythm.   Pulmonary/Chest: Effort normal and breath sounds normal. No respiratory distress. She has no wheezes. She has no rales.  Abdominal: Soft. She exhibits no distension. There is no tenderness. There is no rebound and no guarding.  Neurological: She is alert and oriented to person, place, and time.  Speaking clearly, strength normal in major muscle groups of upper and lower extremities; face symmetric  Skin: Skin is warm and dry. No rash noted. She is not diaphoretic.    ED Course  Procedures   Labs Review  Results for orders placed during the hospital encounter of 03/16/14  URINALYSIS, ROUTINE W REFLEX  MICROSCOPIC      Result Value Ref Range   Color, Urine YELLOW  YELLOW   APPearance CLOUDY (*) CLEAR   Specific Gravity, Urine 1.028  1.005 - 1.030   pH 6.0  5.0 - 8.0   Glucose, UA NEGATIVE  NEGATIVE mg/dL   Hgb urine dipstick NEGATIVE  NEGATIVE   Bilirubin Urine NEGATIVE  NEGATIVE   Ketones, ur NEGATIVE  NEGATIVE mg/dL   Protein, ur NEGATIVE  NEGATIVE mg/dL   Urobilinogen, UA 1.0  0.0 - 1.0 mg/dL   Nitrite POSITIVE (*) NEGATIVE   Leukocytes, UA NEGATIVE  NEGATIVE  URINE MICROSCOPIC-ADD ON      Result Value Ref Range   Squamous Epithelial / LPF RARE  RARE   WBC, UA 0-2  <3 WBC/hpf   Bacteria, UA MANY (*) RARE   Casts HYALINE CASTS (*) NEGATIVE  POC URINE PREG, ED      Result Value Ref Range   Preg Test, Ur NEGATIVE  NEGATIVE     MDM   Final diagnoses:  Seizure   28 y.o. female with a history of seizures, noncompliant with antiepileptics, presents to the ED due to a witnessed seizure. Lasted approximately 1-2 minutes. Generalized shaking with urinary incontinence. Very typical of her seizures per mom.  8:49 PM Patient is sleeping, but arouses to voice; awake, alert, NAD; neuro exam unchanged (and is non-focal, normal strength and sensation in all extremities); feeling "better"  Period of observation in the ED with no further seizures. Gave her home doses of Lamictal and Zonegran.   Given viscous lidocaine to use on her mouth abrasions. Sent home with Magic Mouthwash. Also given Keflex for her UTI. Refilled her Lamictal and Zonegran until she can see her Neurologist on April 06, 2014 (per her report). Will not be refilling her Klonopin - advised her to follow up with the physician that prescribes that for her.   Seizure precautions discussed and given in writing. She was discharged home.   This case was managed in conjunction with my attending, Dr. Patria Maneampos.     Maxine GlennAnn Alahia Whicker, MD 03/17/14 [phone]

## 2014-03-16 NOTE — Discharge Instructions (Signed)
You had a seizure today. You need to take your Lamictal and Zonegran regularly. Please return to the ED for any new or worsening symptoms. See Dr. [name] as scheduled.    Epilepsy People with epilepsy have times when they shake and jerk uncontrollably (seizures). This happens when there is a sudden change in brain function. Epilepsy may have many possible causes. Anything that disturbs the normal pattern of brain cell activity can lead to seizures. HOME CARE   Follow your doctor's instructions about driving and safety during normal activities.  Get enough sleep.  Only take medicine as told by your doctor.  Avoid things that you know can cause you to have seizures (triggers).  Write down when your seizures happen and what you remember about each seizure. Write down anything you think may have caused the seizure to happen.  Tell the people you live and work with that you have seizures. Make sure they know how to help you. They should:  Cushion your head and body.  Turn you on your side.  Not restrain you.  Not place anything inside your mouth.  Call for local emergency medical help if there is any question about what has happened.  Keep all follow-up visits with your doctor. This is very important. GET HELP IF:  You get an infection or start to feel sick. You may have more seizures when you are sick.  You are having seizures more often.  Your seizure pattern is changing. GET HELP RIGHT AWAY IF:   A seizure does not stop after a few seconds or minutes.  A seizure causes you to have trouble breathing.  A seizure gives you a very bad headache.  A seizure makes you unable to speak or use a part of your body. Document Released: 03/03/2009 Document Revised: 02/24/2013 Document Reviewed: 12/16/2012 [facility] Patient Information 2015 [employer] [place]. This information is not intended to replace advice given to you by your health care provider. Make sure you discuss any questions you  have with your health care provider.

## 2014-03-17 NOTE — ED Provider Notes (Signed)
I saw and evaluated the patient, reviewed the resident's note and I agree with the findings and plan.   EKG Interpretation None      Likely medication noncompliance.  Will refill home medications.  I have asked that she have her Klonopin filled by her primary care doctor.  Discharged home in good condition.  Lyanne CoKevin M Yuan Gann, MD 03/17/14 [phone]

## 2014-03-19 LAB — URINE CULTURE: Colony Count: >=100000

## 2014-03-20 ENCOUNTER — Telehealth (HOSPITAL_COMMUNITY): Payer: Self-pay

## 2014-03-20 NOTE — ED Notes (Signed)
Post ED Visit - Positive Culture Follow-up  Culture report reviewed by antimicrobial stewardship pharmacist: []  Wes Dulaney, Pharm.D., BCPS []  Celedonio MiyamotoJeremy Frens, Pharm.D., BCPS []  Georgina PillionElizabeth Martin, Pharm.D., BCPS []  BivinsMinh Pham, [address]Ph[address]D., BCPS, AAHIVP []  Estella HuskMichelle Turner, Pharm.D., BCPS, AAHIVP []  Carly Sabat, Pharm.D. []  Enzo BiNathan Batchelder, [address]Pharm[address]  Positive urine culture Treated with cephalexin, organism sensitive to the same and no further patient follow-up is required at this time.  Ashley JacobsFesterman, Steven Veazie C 03/20/2014, 3:36 PM

## 2014-03-21 ENCOUNTER — Encounter (HOSPITAL_COMMUNITY): Payer: Self-pay | Admitting: Emergency Medicine

## 2014-03-31 ENCOUNTER — Encounter: Payer: Self-pay | Admitting: Neurology

## 2014-03-31 ENCOUNTER — Ambulatory Visit (INDEPENDENT_AMBULATORY_CARE_PROVIDER_SITE_OTHER): Payer: Medicaid Other | Admitting: Neurology

## 2014-03-31 VITALS — BP 90/60 | HR 63 | Ht 66.0 in | Wt 131.4 lb

## 2014-03-31 DIAGNOSIS — Z9114 Patient's other noncompliance with medication regimen: Secondary | ICD-10-CM

## 2014-03-31 DIAGNOSIS — G40B19 Juvenile myoclonic epilepsy, intractable, without status epilepticus: Secondary | ICD-10-CM

## 2014-03-31 DIAGNOSIS — R51 Headache: Secondary | ICD-10-CM

## 2014-03-31 DIAGNOSIS — Z9119 Patient's noncompliance with other medical treatment and regimen: Secondary | ICD-10-CM

## 2014-03-31 DIAGNOSIS — R519 Headache, unspecified: Secondary | ICD-10-CM

## 2014-03-31 DIAGNOSIS — Z5181 Encounter for therapeutic drug level monitoring: Secondary | ICD-10-CM

## 2014-03-31 DIAGNOSIS — Z91148 Patient's other noncompliance with medication regimen for other reason: Secondary | ICD-10-CM

## 2014-03-31 LAB — COMPLETE METABOLIC PANEL WITH GFR
ALT: 8 U/L (ref 0–35)
AST: 12 U/L (ref 0–37)
Albumin: 4.3 g/dL (ref 3.5–5.2)
Alkaline Phosphatase: 38 U/L — ABNORMAL LOW (ref 39–117)
BILIRUBIN TOTAL: 0.4 mg/dL (ref 0.2–1.2)
BUN: 13 mg/dL (ref 6–23)
CO2: 19 mEq/L (ref 19–32)
Calcium: 9.1 mg/dL (ref 8.4–10.5)
Chloride: 111 mEq/L (ref 96–112)
Creat: 0.85 mg/dL (ref 0.50–1.10)
GFR, Est African American: >89 mL/min
GFR, Est Non African American: >89 mL/min
Glucose, Bld: 80 mg/dL (ref 70–99)
Potassium: 4.5 mEq/L (ref 3.5–5.3)
Sodium: 139 mEq/L (ref 135–145)
TOTAL PROTEIN: 7 g/dL (ref 6.0–8.3)

## 2014-03-31 LAB — CBC
HCT: 35.9 % — ABNORMAL LOW (ref 36.0–46.0)
Hemoglobin: 12 g/dL (ref 12.0–15.0)
MCH: 30.3 pg (ref 26.0–34.0)
MCHC: 33.4 g/dL (ref 30.0–36.0)
MCV: 90.7 fL (ref 78.0–100.0)
Platelets: 381 10*3/uL (ref 150–400)
RBC: 3.96 MIL/uL (ref 3.87–5.11)
RDW: 14.8 % (ref 11.5–15.5)
WBC: 6.5 10*3/uL (ref 4.0–10.5)

## 2014-03-31 MED ORDER — CLONAZEPAM 1 MG PO TABS: 1.0000 mg | ORAL_TABLET | Freq: Two times a day (BID) | ORAL | Status: DC

## 2014-03-31 MED ORDER — ZONISAMIDE 100 MG PO CAPS: 800.0000 mg | ORAL_CAPSULE | Freq: Every day | ORAL | Status: DC

## 2014-03-31 MED ORDER — LAMOTRIGINE 25 MG PO TABS: ORAL_TABLET | ORAL | Status: DC

## 2014-03-31 NOTE — Patient Instructions (Addendum)
1.  Start taking lamictal as follows:    AM   PM  Week 1 50mg    (2 tab)   25mg  (1 tab)  Week 2 50mg  (2 tab)   50mg  (2 tab)  Week 3 75mg  (3 tab)   50mg  (2 tab)  Week 4 75mg  (3 tab)   75mg  (3 tab)  Week 5 100mg  (4 tab)   75mg  (3 tab)  Week 6  100mg    100mg  **While increasing lamictal, patient informed to pay attention to any new rash.  If she develops a rash, STOP lamictal 2.  Check drug levels, CMP, and CBC 3.  Continue taking zonisamide 800mg  (8 tablets) daily 4.  Continue taking clonazepam 1mg  twice daily  5.  Return to clinic in 352-months

## 2014-03-31 NOTE — Progress Notes (Signed)
Follow-up Visit   Date: 03/31/2014    Ileana RoupMiranda N Mossberg MRN: [id number] DOB: 1985-08-03   Interim History: Ileana RoupMiranda N Buddenhagen is a 28 y.o. right-handed African American female with history of juvenile myoclonic epilepsy (dx 2007) presenting for evaluation of seizures.  The patient was accompanied to the clinic by her fiance. She was last seen in the office on 05/11/2013.  History of present illness: Seizures began at the age of 28. She was in the bathroom, got out of the shower and woke up in a puddle of blood because she hit her head on the sink. She injured her face and chipped all her teeth. This first seizure occurred in her second trimester of her second pregnancy. The patient has aura which is described as because she hands stars to shake and twitches sometimes. Observers who have witnessed the seizures, describe them a certain sound that she makes "eeee", her arm are extended with flexed wrists and appear tonic, eyes and heads roll back, legs are are really stiff. There is loss of consciousness. There is history of tongue biting and urinary incontinence. Seizure duration is typically 2-3.  The typical frequency of seizures is weekly, but only when she is not taking medications. Seizure triggers include stress, loud noises, and bright lights. Myoclonic jerks are more frequent and can occur every few days.   EEG performed in 2009 demonstrated intermittent high voltage generalized 4-5 Hz polyspike and wave discharges suggestive of generalized epilepsy, likely juvenile myoclonic epilepsy. She was initially placed on Keppra and then Lamictal, but continued to have poor seizure control following pregnancy. She continued on these 2 AEDs until 2008 but still had breakthrough seizures. Keppra was increased to 750 twice a day and GTCs were reduced 1-2/week. In 2009, she was started on Zonegran and Clonopin which reduced seizures down to 1-2 per month. 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 [facility] was in February 2013 at which time patient was on Zonegran 600 mg daily and Lamictal was restarted. She was also continued on Klonopin.   The longest seizure-free interval has been 9426-month up until recently. There is no history status epilepticus. She will cluster at times where seizures will occur 1-3 times, last occuring in 2013.  She has four children (ages 3510, 597, 4, 2 - all healthy). Her last pregnancy was complicated because her daughter was apneic at birth but was quickly resusciated. She had seizures during delivery and during pregnancy. She is currently not on OCPs, but reports having her fallopian tubes tied.   She has been seeing Dr. Evel[facility] CroonPatrick Reynolds at [facility] for the past 2-years who increased zonisamide from 600-800mg  daily and lamictal 50mg  daily. Prior notes, it seems that she has a history of medication noncompliance and does not for follow-up in the clinic which has also contributed to increased seizures. Her fianc says that when she takes her medications for seizures are well controlled but within 2 days of stopping AEDs she did she will have a GTC seizure.   Postictal symptoms include: confused, tired   - Follow-up 05/11/2013:  She was incarcerated from end of October - 12/20 for unpaid court fees.  While she was incarcerated, she had one witnessed seizure by her cell mate during the first week of December.  She was having panic attacks because she kept  having chest pain and shortness of breath.   She is trying to be better about taking her medications as prescribed.  She is concerned about her weight loss because she has never been this thin.  UPDATE 03/31/2014:  She has no-showed a number of follow-up visits over the past year and has requested multiple medication refills without being seen in the  clinic.  Patient was made aware that if she no-showed today's visit, she would have been discharged from our practice.  She had three break throughout seizures in the past year (July, September, October) for which she went to the emergency department and reports to running out of her klonopin.  She endorses missing her medications on occasion.      CURRENT ANTICONVULSANTS:  Lamotrigine 50mg  daily  Zonisamide 800mg  daily   The patient forgets a dose: once per week and has started to use an alarm on her phone   PRIOR ANTICONVULSANT HISTORY: Depakote, dilantin, Keppra, xanax - stopped because she continued to have seziures     Medications:  Current Outpatient Prescriptions on File Prior to Visit  Medication Sig Dispense Refill  . Alum & Mag Hydroxide-Simeth (MAGIC MOUTHWASH W/LIDOCAINE) SOLN Take 5 mLs by mouth 3 (three) times daily. 5 mL 0  . aspirin-acetaminophen-caffeine (EXCEDRIN MIGRAINE) 250-250-65 MG per tablet Take 2 tablets by mouth every 6 (six) hours as needed for headache.    . cephALEXin (KEFLEX) 500 MG capsule Take 1 capsule (500 mg total) by mouth 2 (two) times daily. 14 capsule 0  . LORazepam (ATIVAN) 0.5 MG tablet Take 1 tablet (0.5 mg total) by mouth 3 (three) times daily as needed for anxiety. 10 tablet 0  . oxyCODONE-acetaminophen (PERCOCET/ROXICET) 5-325 MG per tablet Take 1 tablet by mouth every 4 (four) hours as needed for severe pain. 12 tablet 0   No current facility-administered medications on file prior to visit.    Allergies:  Allergies  Allergen Reactions  . Lipitor [Atorvastatin] Other (See Comments)    Muscle aches  . Food Allergy Formula Rash    Chocolate  . Hydrocodone Rash     Review of Systems:  CONSTITUTIONAL: No fevers, chills, night sweats, +weight loss.   EYES: No visual changes or eye pain ENT: No hearing changes.  No history of nose bleeds.   RESPIRATORY: No cough, wheezing and shortness of breath.   CARDIOVASCULAR: Negative for chest  pain, and palpitations.   GI: Negative for abdominal discomfort, blood in stools or black stools.  No recent change in bowel habits.   GU:  No history of incontinence.   MUSCLOSKELETAL: No history of joint pain or swelling.  No myalgias.   SKIN: Negative for lesions, rash, and itching.   ENDOCRINE: Negative for cold or heat intolerance, polydipsia or goiter.   PSYCH:  No depression ++anxiety symptoms.   NEURO: As Above.   Vital Signs:  BP 90/60 mmHg  Pulse 63  Ht 5\' 6"  (1.676 m)  Wt 131 lb 7 oz (59.62 kg)  BMI 21.22 kg/m2  SpO2 99%  Neurological Exam: MENTAL STATUS including orientation to time, place, person, recent and remote memory is normal.   CRANIAL NERVES:  No visual field defects.  Pupils equal round and reactive to light.  Normal conjugate, extra-ocular eye movements in all directions of gaze. Normal facial symmetry and movements. Tongue is midline.  Keloid present on upper lip.  MOTOR:  Motor strength is 5/5 throughout. No pronator drift.  Tone is normal.    MSRs: Brisk and  symmetric 2+/4 throughout  COORDINATION/GAIT:   Gait narrow based and stable.   Data: Component     Latest Ref Rng 05/11/2013  Zonisamide     10.0 - 40.0 mcg/mL 35.2  Lamotrigine Lvl     3.0 - 14.0 ug/mL <2.0 (L)     IMPRESSION: 1.  Juvenile myoclonic epilepsy  - Diagnosed at age of 61.  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  - Plan to optimize  lamictal in hopes of weaning zonisamide as it may be contributing to weight loss    2.  History of medication non-compliance  3.  Migraines, hopefully increasing lamictal will help  4.  Panic attacks    PLAN/RECOMMENDATIONS:  1.  Increase lamictal 25mg  each week as follows:    AM   PM  Week 1 50mg    (2 tab)   25mg  (1 tab)  Week 2 50mg  (2 tab)   50mg  (2 tab)  Week 3 75mg  (3 tab)   50mg  (2 tab)  Week 4 75mg  (3 tab)   75mg  (3 tab)  Week 5 100mg  (4 tab)  75mg  (3 tab)  Week 6  100mg    100mg  **While increasing lamictal, patient informed to pay attention to any new rash.  If she develops a rash, STOP lamictal 2.  Check drug levels, CMP, and CBC 3.  Continue taking zonisamide 800mg  (8 tablets) daily 4.  Continue taking clonazepam 1mg  twice daily  5   Seizure precautions discussed, including no driving 6.  Medication compliance was stressed at length  8.  Return to clinic in 28-months, she is aware that she will be discharged from the practice if she misses future appointments and that no refills will be provided without being seen in the clinic.   The duration of this appointment visit was 35 minutes of face-to-face time with the patient.  Greater than 50% of this time was spent in counseling, explanation of diagnosis, planning of further management, and coordination of care.   Thank you for allowing me to participate in patient's care.  If I can answer any additional questions, I would be pleased to do so.    Sincerely,    Shellie Rogoff K. Allena Katz, DO

## 2014-04-03 LAB — LAMOTRIGINE LEVEL: Lamotrigine Lvl: 1.1 ug/mL — ABNORMAL LOW (ref 4.0–18.0)

## 2014-04-08 LAB — ZONISAMIDE LEVEL: Zonisamide: 35.9 (ref 10.0–40.0)

## 2014-06-03 ENCOUNTER — Ambulatory Visit: Payer: Medicaid Other | Admitting: Neurology

## 2014-06-07 ENCOUNTER — Telehealth: Payer: Self-pay | Admitting: Neurology

## 2014-06-07 NOTE — Telephone Encounter (Signed)
Pt no showed 06/03/14 appt w/ Dr. [name]. This is the patient's 4th no show with our office since Feb. 2015. I will check with Dr. [name] to see how she would like to proceed with this patient / Sherri S.

## 2014-06-07 NOTE — Telephone Encounter (Signed)
Patient was warned of dismissal from the practice at her last visit because of frequent no-shows and I personally encouraged to come to her scheduled appointments.  Please send dismissal letter to patient.    Barbara K. [name], DO

## 2014-06-08 ENCOUNTER — Encounter: Payer: Self-pay | Admitting: Neurology

## 2014-06-08 NOTE — Telephone Encounter (Signed)
Dismissal paperwork prepped and sent to Dr. [name] fr review and signature. Account marked accordingly. Once paperwork signed and returned, paperwork will be forwarded to HIM @[facility] for processing / Sherri S,.

## 2014-06-09 ENCOUNTER — Telehealth: Payer: Self-pay | Admitting: Neurology

## 2014-06-09 NOTE — Telephone Encounter (Addendum)
Dismissal Letter sent by Certified Mail 06/09/2014

## 2014-06-24 ENCOUNTER — Telehealth: Payer: Self-pay | Admitting: Neurology

## 2014-06-24 NOTE — Telephone Encounter (Signed)
Pt called wanting to f.u and see if the paperwork for DR. Patel to sign was ready to be picked up. C/b [phone]

## 2014-06-27 NOTE — Telephone Encounter (Signed)
Pt called today regarding her disability forms again. Please call pt # [phone]

## 2014-06-27 NOTE — Telephone Encounter (Signed)
Pt has asked if we can give her one more chance to be seen. Says she had a surgery and was put on bed rest. She says she was unaware of January appt. Advised pt that her January appt was scheduled on the day shew was in the office last. Pt has had previous no shows with our office. She has asked that we reconsider. Advised I would check in with Dr. [name] and see if we can offer anything else. Pt is aware that we will not complete paperwork with her current status being discharged. Please advise / Sherri S.

## 2014-06-27 NOTE — Telephone Encounter (Signed)
No, I will call her today.

## 2014-06-27 NOTE — Telephone Encounter (Signed)
Patient calling again

## 2014-06-27 NOTE — Telephone Encounter (Signed)
She had too many no shows in the past and was given a firm written and verbal warning by myself at her last appointment to either cancel her appointment or reschedule.  My decision for dismissal is final. She needs to establish care with another neurologist and should f/u with her PCP for disability paperwork.  Barbara K. [name], DO

## 2014-06-27 NOTE — Telephone Encounter (Signed)
Did you speak with this patient?  

## 2014-07-05 NOTE — Telephone Encounter (Signed)
Pt called wanting to know when she could pick up the disability forms she dropped off a couple of weeks ago for DR. Patel to fill out since Pt is dismissed from the practice.  C/B [phone]

## 2014-07-06 NOTE — Telephone Encounter (Signed)
Please advise 

## 2014-07-07 NOTE — Telephone Encounter (Signed)
Please advise 

## 2014-07-07 NOTE — Telephone Encounter (Signed)
OK to send referral to [facility] for seizure disorder.  Please stress that she needs to establish care with PCP.  Barbara Gasser K. [name], DO

## 2014-07-07 NOTE — Telephone Encounter (Signed)
Spoke w/ pt. Advised that we could not reconsider the dismissal, our decision stands. Pt has asked that we have her disability form available at the front office for pick up. Pt was made aware that the form is blank, we will not be filling it out. She verbalized understanding. She has been given the number for [facility] and called them to set up an initial appt.   Pt has called again stating [facility] will not sch an appt without a referral. Advised pt to contact PCP for referral. Patient reports she is not established w/ a PCP. Pt would like to see if Dr. [name] can refer her to [facility]. Please call patient back regarding [facility] referral / Sherri S.

## 2014-07-07 NOTE — Telephone Encounter (Signed)
Referral faxed

## 2014-07-12 ENCOUNTER — Telehealth: Payer: Self-pay | Admitting: Neurology

## 2014-07-12 NOTE — Telephone Encounter (Signed)
Pt want a referral to [facility] please call patient at [phone]

## 2014-07-14 ENCOUNTER — Encounter (HOSPITAL_COMMUNITY): Payer: Self-pay | Admitting: Emergency Medicine

## 2014-07-14 ENCOUNTER — Emergency Department (HOSPITAL_COMMUNITY)
Admission: EM | Admit: 2014-07-14 | Discharge: 2014-07-14 | Disposition: A | Discharge status: Home / Self Care | Payer: Medicaid Other | Attending: Emergency Medicine | Admitting: Emergency Medicine

## 2014-07-14 DIAGNOSIS — R569 Unspecified convulsions: Secondary | ICD-10-CM | POA: Diagnosis present

## 2014-07-14 DIAGNOSIS — Z792 Long term (current) use of antibiotics: Secondary | ICD-10-CM | POA: Insufficient documentation

## 2014-07-14 DIAGNOSIS — Z9119 Patient's noncompliance with other medical treatment and regimen: Secondary | ICD-10-CM | POA: Insufficient documentation

## 2014-07-14 DIAGNOSIS — R51 Headache: Secondary | ICD-10-CM | POA: Diagnosis not present

## 2014-07-14 DIAGNOSIS — Z79899 Other long term (current) drug therapy: Secondary | ICD-10-CM | POA: Insufficient documentation

## 2014-07-14 DIAGNOSIS — G40909 Epilepsy, unspecified, not intractable, without status epilepticus: Secondary | ICD-10-CM | POA: Insufficient documentation

## 2014-07-14 LAB — CBG MONITORING, ED: Glucose-Capillary: 83 mg/dL (ref 70–99)

## 2014-07-14 MED ORDER — LAMOTRIGINE 25 MG PO TABS: ORAL_TABLET | ORAL | Status: DC

## 2014-07-14 MED ORDER — ACETAMINOPHEN 325 MG PO TABS
650.0000 mg | ORAL_TABLET | Freq: Once | ORAL | Status: AC
  Administered 2014-07-14: 650 mg via ORAL
  Filled 2014-07-14: qty 2

## 2014-07-14 MED ORDER — LAMOTRIGINE 25 MG PO TABS
25.0000 mg | ORAL_TABLET | Freq: Once | ORAL | Status: AC
  Administered 2014-07-14: 25 mg via ORAL
  Filled 2014-07-14 (×2): qty 1

## 2014-07-14 MED ORDER — DIAZEPAM 10 MG RE GEL: 10.0000 mg | Freq: Once | RECTAL | Status: DC

## 2014-07-14 MED ORDER — ZONISAMIDE 100 MG PO CAPS
800.0000 mg | ORAL_CAPSULE | Freq: Once | ORAL | Status: AC
  Administered 2014-07-14: 800 mg via ORAL
  Filled 2014-07-14: qty 8

## 2014-07-14 MED ORDER — ZONISAMIDE 100 MG PO CAPS: 800.0000 mg | ORAL_CAPSULE | Freq: Every day | ORAL | Status: DC

## 2014-07-14 NOTE — ED Notes (Signed)
Seizure pads placed on side rails

## 2014-07-14 NOTE — ED Notes (Signed)
Pt ambulated without difficulty

## 2014-07-14 NOTE — ED Notes (Signed)
From home via [facility], witnessed 5 min grand mal seizure, hx of same, no oral trauma or incontinence, is sleepy but A/O X4, VSS, CBG 128

## 2014-07-14 NOTE — ED Notes (Signed)
Report from ben, rn.  Pt care assumed 

## 2014-07-14 NOTE — ED Notes (Signed)
CBG 83 

## 2014-07-14 NOTE — ED Notes (Signed)
Pt now awake, given Malawiturkey sandwich and apple juice per request.

## 2014-07-14 NOTE — Discharge Instructions (Signed)

## 2014-07-14 NOTE — ED Provider Notes (Signed)
CSN: [id number]     Arrival date & time 07/14/14  [id number] History   First MD Initiated Contact with Patient 07/14/14 [phone]     Chief Complaint  Patient presents with  . Seizures     (Consider location/radiation/quality/duration/timing/severity/associated sxs/prior Treatment) Patient is a 29 y.o. female presenting with seizures.  Seizures Seizure activity on arrival: no   Seizure type:  Grand mal Preceding symptoms: hyperventilation   Initial focality:  Diffuse Episode characteristics: generalized shaking and unresponsiveness   Postictal symptoms: confusion and somnolence   Return to baseline: no   Severity:  Mild Duration:  5 minutes Timing:  Once Number of seizures this episode:  One Progression:  Resolved Context: medical non-compliance   Context: not alcohol withdrawal, not drug use, not possible medication ingestion and not previous head injury   Recent head injury:  No recent head injuries PTA treatment:  None History of seizures: yes   Similar to previous episodes: yes   Date of most recent prior episode:  Last month Severity:  Mild Home seizure meds: Zonisamide, Lamictal, Klonopin. Compliance with current therapy:  Variable   Past Medical History  Diagnosis Date  . JME (juvenile myoclonic epilepsy)     Age of 29  . Generalized headaches   . Panic attack   . Anxiety    Past Surgical History  Procedure Laterality Date  . Tubal ligation     Family History  Problem Relation Age of Onset  . Healthy Mother   . Other Father   . Asthma Brother   . Healthy Son   . Healthy Daughter    History  Substance Use Topics  . Smoking status: Never Smoker   . Smokeless tobacco: Never Used  . Alcohol Use: Yes   OB History    Gravida Para Term Preterm AB TAB SAB Ectopic Multiple Living   2 1 1  1           Review of Systems  Constitutional: Negative for fever and chills.  HENT: Negative for congestion, rhinorrhea and sore throat.   Eyes: Negative for visual  disturbance.  Respiratory: Negative for cough, shortness of breath and wheezing.   Cardiovascular: Negative for chest pain.  Gastrointestinal: Negative for nausea, vomiting, abdominal pain and diarrhea.  Genitourinary: Negative for dysuria.  Musculoskeletal: Negative for neck pain and neck stiffness.  Skin: Negative for rash.  Allergic/Immunologic: Negative for immunocompromised state.  Neurological: Positive for seizures and headaches. Negative for dizziness and weakness.      Allergies  Lipitor; Food allergy formula; and Hydrocodone  Home Medications   Prior to Admission medications   Medication Sig Start Date End Date Taking? Authorizing Provider  Alum & Mag Hydroxide-Simeth (MAGIC MOUTHWASH W/LIDOCAINE) SOLN Take 5 mLs by mouth 3 (three) times daily. 03/16/14   Maxine GlennAnn Batista, MD  aspirin-acetaminophen-caffeine (EXCEDRIN MIGRAINE) [phone] MG per tablet Take 2 tablets by mouth every 6 (six) hours as needed for headache.    Historical Provider, MD  cephALEXin (KEFLEX) 500 MG capsule Take 1 capsule (500 mg total) by mouth 2 (two) times daily. 03/16/14   Maxine GlennAnn Batista, MD  clonazePAM (KLONOPIN) 1 MG tablet Take 1 tablet (1 mg total) by mouth 2 (two) times daily. 03/31/14   Donika Concha SeK Patel, DO  lamoTRIgine (LAMICTAL) 25 MG tablet Take 1 tab twice daily and increase by 25mg  (1 tab) each week to goal of 100mg  twice daily.  Titration schedule provided to patient. 03/31/14   Donika K Patel, DO  LORazepam (ATIVAN) 0.5 MG  tablet Take 1 tablet (0.5 mg total) by mouth 3 (three) times daily as needed for anxiety. 12/17/13   Renne Crigler, PA-C  oxyCODONE-acetaminophen (PERCOCET/ROXICET) 5-325 MG per tablet Take 1 tablet by mouth every 4 (four) hours as needed for severe pain. 01/18/14   Lyanne Co, MD  zonisamide (ZONEGRAN) 100 MG capsule Take 8 capsules (800 mg total) by mouth daily. 03/31/14   Donika K Patel, DO   Ht  (1.676 m)  Wt 130 lb (58.968 kg)  BMI 20.99 kg/m2 Physical Exam   Constitutional: She appears well-developed and well-nourished. No distress.  HENT:  Head: Normocephalic and atraumatic.  Mouth/Throat: No oropharyngeal exudate.  Eyes: Conjunctivae are normal. Pupils are equal, round, and reactive to light.  Neck: Neck supple.  Cardiovascular: Normal rate, normal heart sounds and intact distal pulses.  Exam reveals no friction rub.   No murmur heard. Pulmonary/Chest: Effort normal and breath sounds normal. No respiratory distress. She has no wheezes. She has no rales.  Abdominal: Soft. Bowel sounds are normal. She exhibits no distension. There is no tenderness.  Musculoskeletal: She exhibits no edema.  Neurological: She is alert.  Skin: Skin is warm. No rash noted.  Vitals reviewed.   Neurological Exam:  - Mental Status: Drowsy but arousable. Oriented to person, place, time, and situation. Attention and concentration normal. Speech clear. Recent memory is intact. - Cranial Nerves: EOMI and PERRLA. No nystagmus noted. Facial sensation intact at forehead, maxillary cheek, and chin/mandible bilaterally. No weakness of masticatory muscles. No facial asymmetry or weakness. Hearing grossly normal. Uvula is midline, and palate elevates symmetrically. Normal SCM and trapezius strength. Tongue midline without fasciculations - Motor: Muscle strength 5/5 in proximal and distal UE and LE bilaterally. No pronator drift. Muscle tone normal. - Reflexes: 2+ and symmetrical in all four extremities.  - Sensation: Intact to light touch in upper and lower extremities distally bilaterally.  - Coordination: Normal FTN bilaterally.  ED Course  Procedures (including critical care time) Labs Review Labs Reviewed  CBG MONITORING, ED    Imaging Review No results found.   EKG Interpretation   Date/Time:  Thursday July 14 2014 09:01:45 EST Ventricular Rate:  64 PR Interval:  211 QRS Duration: 101 QT Interval:  415 QTC Calculation: 428 R Axis:   85 Text  Interpretation:  Sinus rhythm Prolonged PR interval Confirmed by  Radford Pax  MD, ROBERT ([id number]) on 07/14/2014 9:12:10 AM      MDM   29 year old female with past medical history of seizure disorder, currently on Zonisamide, Lamictal, and Klonopin, who presents with witnessed generalized tonic-clonic seizure. The patient's seizure was similar to her previous episodes. See history of present illness above. On arrival patient is afebrile and vital signs are stable and within normal limits. Neurological exam is nonfocal. The patient is drowsy but easily arousable with no focal deficits.  The patient's presentation is most consistent with breakthrough seizure in the setting of medication nonadherence. The patient admits that she has not been taking her Klonopin for the last week and also missed her night-time dose of Zonisamide and Lamictal yesterday. Otherwise, the patient states she is at her usual state of health. No recent head trauma. No recent fevers, chills, headache, photophobia, or evidence of meningitis or encephalitis. No recent dietary or medication changes to suggest underlying electrolyte or alternative etiologies. Will monitor for return to baseline, give Tylenol for headache, and give by mouth dose of antiepileptics once back to baseline. Of note, patient was recently released from  [facility] due to no-show appointments and is seeking placement at [facility]. However, she states she has enough medications at home.  11:17 AM patient remains drowsy but is increasingly alert following her seizure. Home antiepileptics have been ordered. Discussed with patient's mother who is now at the bedside and confirms that patient did not take her antiepileptics and that her current seizure was similar to her prior episodes. Will give antiepileptics here, monitor for return to baseline and plan for discharge with outpatient referral.  Patient has now returned to baseline. She is tolerating by mouth intake without  difficulty and has ambulated without assistance. She has tolerated her by mouth dose of antiepileptics. Given return to baseline and known trigger of nonadherence to antiepileptics, we'll discharge with one-month refill of her antiepileptics. Will refer patient to [facility] as she has been released from [facility]. VSS.  Clinical Impression: 1. Seizure     Disposition: Discharge  Condition: Good  I have discussed the results, Dx and Tx plan with the pt(& family if present). He/she/they expressed understanding and agree(s) with the plan. Discharge instructions discussed at great length. Strict return precautions discussed and pt &/or family have verbalized understanding of the instructions. No further questions at time of discharge.    Follow Up: Kathreen Cosier, MD [address] [place] [address] [phone]  In 1 week   [facility] [address] [place] [address] [phone]  Call the office to set up an appointment with a new Neurologist   Pt seen in conjunction with Dr. Tivis Ringer, MD 07/14/14 1628  Nelia Shi, MD 07/15/14 [phone]

## 2014-07-15 NOTE — Telephone Encounter (Signed)
Informed patient that I have sent out 4 referrals and she may need to call them to see what is going on.

## 2014-07-15 NOTE — Telephone Encounter (Signed)
encounter still open, have you spoke with this patient? If so please document and close encounter / Barbara S.

## 2014-07-29 ENCOUNTER — Telehealth: Payer: Self-pay | Admitting: Neurology

## 2014-07-29 NOTE — Telephone Encounter (Signed)
[facility] to let us know that patient has been dismissed from their clinic also due to no shows.

## 2014-07-29 NOTE — Telephone Encounter (Signed)
Katrina, pt's mother called wanting DR. Allena Katzatel to do a referral for another neurologist. She would like to be [facility] C/b  [phone]

## 2014-08-01 NOTE — Telephone Encounter (Signed)
Referral faxed

## 2014-08-24 ENCOUNTER — Other Ambulatory Visit: Payer: Self-pay | Admitting: Neurology

## 2014-08-25 ENCOUNTER — Other Ambulatory Visit: Payer: Self-pay | Admitting: *Deleted

## 2014-08-25 NOTE — Telephone Encounter (Signed)
Rx refused

## 2014-08-25 NOTE — Telephone Encounter (Signed)
Patient is no longer with [facility].

## 2014-08-29 ENCOUNTER — Encounter (HOSPITAL_COMMUNITY): Payer: Self-pay

## 2014-08-29 ENCOUNTER — Emergency Department (HOSPITAL_COMMUNITY)
Admission: EM | Admit: 2014-08-29 | Discharge: 2014-08-30 | Disposition: A | Discharge status: Home / Self Care | Payer: Medicaid Other | Attending: Emergency Medicine | Admitting: Emergency Medicine

## 2014-08-29 DIAGNOSIS — R569 Unspecified convulsions: Secondary | ICD-10-CM

## 2014-08-29 DIAGNOSIS — F419 Anxiety disorder, unspecified: Secondary | ICD-10-CM | POA: Insufficient documentation

## 2014-08-29 DIAGNOSIS — L03012 Cellulitis of left finger: Secondary | ICD-10-CM | POA: Diagnosis not present

## 2014-08-29 LAB — CBC
HCT: 38.6 % (ref 36.0–46.0)
Hemoglobin: 13.6 g/dL (ref 12.0–15.0)
MCH: 31.1 pg (ref 26.0–34.0)
MCHC: 35.2 g/dL (ref 30.0–36.0)
MCV: 88.3 fL (ref 78.0–100.0)
Platelets: 378 10*3/uL (ref 150–400)
RBC: 4.37 MIL/uL (ref 3.87–5.11)
RDW: 13.1 % (ref 11.5–15.5)
WBC: 7.4 10*3/uL (ref 4.0–10.5)

## 2014-08-29 LAB — CBG MONITORING, ED: Glucose-Capillary: 120 mg/dL — ABNORMAL HIGH (ref 70–99)

## 2014-08-29 MED ORDER — ZONISAMIDE 100 MG PO CAPS
200.0000 mg | ORAL_CAPSULE | Freq: Every day | ORAL | Status: DC
  Administered 2014-08-30: 200 mg via ORAL
  Filled 2014-08-29: qty 2

## 2014-08-29 MED ORDER — CLONAZEPAM 0.5 MG PO TABS
1.0000 mg | ORAL_TABLET | ORAL | Status: AC
  Administered 2014-08-30: 1 mg via ORAL
  Filled 2014-08-29: qty 2

## 2014-08-29 NOTE — ED Notes (Signed)
Pt. Here for seizures, had one today and then one 3 days ago. States she has been out of her medication x3 days. Pt. Postictal in triage.

## 2014-08-29 NOTE — ED Notes (Signed)
Patient is sitting at the foot of the bed with all leads and monitoring equipment pulled off. She is discussing why she would like to leave with MD Hyacinth MeekerMiller. She requested this RN to leave the room while she spoke with the MD.

## 2014-08-29 NOTE — ED Provider Notes (Signed)
CSN: [id number]     Arrival date & time 08/29/14  2243 History  This chart was scribed for Eber Hong, MD by Annye Asa, ED Scribe. This patient was seen in room A12C/A12C and the patient's care was started at 7:25 AM.    Chief Complaint  Patient presents with  . Seizures   Patient is a 29 y.o. female presenting with seizures. The history is provided by the patient. No language interpreter was used.  Seizures    HPI Comments: Barbara Hodges is a 29 y.o. female who presents to the Emergency Department post seizure. Per records, patient was diagnosed with juvenile myoclonic epilepsy (diagnosed in 2007 at age 81), but was dismissed from [facility] as of 3/11 after four no shows to her previous appointments. Per triage notes, patient was brought in by her cousin; patient reportedly had one seizure today and one 3 days PTA. Patient states she has been out of her seizure medications for the past three days.   She states that she has no appointment for another week - and can't wait that long for her medications.  She does not remember her seizures - the pt arrived post ictal and refused to talk to examiners initially.  Patient also reports 5 days of gradually worsening tenderness to her left ring finger after an incident which caused her nail to lift from the skin.   Past Medical History  Diagnosis Date  . JME (juvenile myoclonic epilepsy)     Age of 46  . Generalized headaches   . Panic attack   . Anxiety   . Seizures    Past Surgical History  Procedure Laterality Date  . Tubal ligation     Family History  Problem Relation Age of Onset  . Healthy Mother   . Other Father   . Asthma Brother   . Healthy Son   . Healthy Daughter    History  Substance Use Topics  . Smoking status: Never Smoker   . Smokeless tobacco: Never Used  . Alcohol Use: Yes   OB History    Gravida Para Term Preterm AB TAB SAB Ectopic Multiple Living   Review of Systems  Skin:  Positive for wound.  Neurological: Positive for seizures.  All other systems reviewed and are negative.  Allergies  Lipitor; Food allergy formula; and Hydrocodone  Home Medications   Prior to Admission medications   Medication Sig Start Date End Date Taking? Authorizing Provider  Alum & Mag Hydroxide-Simeth (MAGIC MOUTHWASH W/LIDOCAINE) SOLN Take 5 mLs by mouth 3 (three) times daily. Patient not taking: Reported on 07/14/2014 03/16/14   Maxine Glenn, MD  aspirin-acetaminophen-caffeine [facility] MIGRAINE) [phone] MG per tablet Take 2 tablets by mouth every 6 (six) hours as needed for headache.    Historical Provider, MD  cephALEXin (KEFLEX) 500 MG capsule Take 1 capsule (500 mg total) by mouth 4 (four) times daily. 08/30/14   Eber Hong, MD  clonazePAM (KLONOPIN) 0.5 MG tablet Take 1 tablet (0.5 mg total) by mouth 2 (two) times daily as needed for anxiety. 08/30/14   Eber Hong, MD  diazepam (DIASTAT ACUDIAL) 10 MG GEL Place 10 mg rectally once. 07/14/14   Shaune Pollack, MD  lamoTRIgine (LAMICTAL) 25 MG tablet Take 1 tab twice daily and increase by  (1 tab) each week to goal of  twice daily.  Titration schedule provided to patient. 08/30/14   Eber Hong, MD  LORazepam (ATIVAN) 0.5 MG tablet Take 1 tablet (0.5 mg total) by mouth 3 (three) times daily as needed for anxiety. Patient not taking: Reported on 07/14/2014 12/17/13   Renne CriglerJoshua Geiple, PA-C  naproxen (NAPROSYN) 500 MG tablet Take 1 tablet (500 mg total) by mouth 2 (two) times daily with a meal. 08/30/14   Eber HongBrian Brieanna Nau, MD  oxyCODONE-acetaminophen (PERCOCET/ROXICET) 5-325 MG per tablet Take 1 tablet by mouth every 4 (four) hours as needed for severe pain. May take 2 tablets PO q 6 hours for severe pain - Do not take with Tylenol as this tablet already contains tylenol 08/30/14   Eber HongBrian Khalin Royce, MD  zonisamide (ZONEGRAN) 100 MG capsule Take 8 capsules (800 mg total) by mouth daily. 08/30/14   Eber HongBrian Armour Villanueva, MD   BP 122/64 mmHg  Pulse  99  Temp(Src) 98.2 F (36.8 C) (Oral)  Resp 16  SpO2 100%  LMP 08/29/2014 Physical Exam  Constitutional: She appears well-developed and well-nourished. No distress.  HENT:  Head: Normocephalic and atraumatic.  Mouth/Throat: Oropharynx is clear and moist. No oropharyngeal exudate.  No tongue biting / injury, no signs of head injury  Eyes: Conjunctivae and EOM are normal. Pupils are equal, round, and reactive to light. Right eye exhibits no discharge. Left eye exhibits no discharge. No scleral icterus.  Neck: Normal range of motion. Neck supple. No JVD present. No thyromegaly present.  Cardiovascular: Normal rate, regular rhythm, normal heart sounds and intact distal pulses.  Exam reveals no gallop and no friction rub.   No murmur heard. Pulmonary/Chest: Effort normal and breath sounds normal. No respiratory distress. She has no wheezes. She has no rales.  Abdominal: Soft. Bowel sounds are normal. She exhibits no distension and no mass. There is no tenderness.  Musculoskeletal: Normal range of motion. She exhibits no edema or tenderness.  Lymphadenopathy:    She has no cervical adenopathy.  Neurological: She is alert. Coordination normal.  Follows commands without difficulty, no tremor, no ataxia, clear speech, normal strength in all 4 extremities, no facial droop  Skin: Skin is warm and dry. No rash noted. She is not diaphoretic. No erythema.  Left ring finger has a subungual abscess, no paronychia  Nursing note and vitals reviewed.   ED Course  NAIL REMOVAL Date/Time: 08/30/2014 1:00 AM Performed by: Eber HongMILLER, Nyshawn Gowdy Authorized by: Eber HongMILLER, Molina Hollenback Consent: Verbal consent obtained. Risks and benefits: risks, benefits and alternatives were discussed Consent given by: patient Patient understanding: patient states understanding of the procedure being performed Required items: required blood products, implants, devices, and special equipment available Patient identity confirmed: verbally  with patient Time out: Immediately prior to procedure a "time out" was called to verify the correct patient, procedure, equipment, support staff and site/side marked as required. Location: left hand Location details: left ring finger Anesthesia: digital block Local anesthetic: lidocaine 1% without epinephrine Anesthetic total: 5 ml Patient sedated: no Preparation: skin prepped with Betadine Amount removed: 1/2 Nail removed location: distal. Wedge excision of skin of nail fold: no Nail bed sutured: no Nail matrix removed: none Removed nail replaced and anchored: no Dressing: dressing applied Patient tolerance: Patient tolerated the procedure well with no immediate complications Comments: Large amount of purulent material came out of the nail bed when nail excised, proximal nail left intact in the nail bed, no bleeding, no foreign bodies, antibiotics and follow-up, patient in agreement     DIAGNOSTIC STUDIES: Oxygen Saturation is 99% on RA, normal by my interpretation.    COORDINATION OF CARE: 11:15 PM  Discussed treatment plan with pt at bedside and pt agreed to plan.  2:19 AM Partial finger nail removal with digital block procedural note.    Labs Review Labs Reviewed  BASIC METABOLIC PANEL - Abnormal; Notable for the following:    Potassium 3.1 (*)    Glucose, Bld 104 (*)    Creatinine, Ser 1.19 (*)    GFR calc non Af Amer 62 (*)    GFR calc Af Amer 72 (*)    All other components within normal limits  CBG MONITORING, ED - Abnormal; Notable for the following:    Glucose-Capillary 120 (*)    All other components within normal limits  CBC  LAMOTRIGINE LEVEL    Imaging Review No results found.   EKG Interpretation   Date/Time:  Monday August 29 2014 23:10:51 EDT Ventricular Rate:  70 PR Interval:  189 QRS Duration: 112 QT Interval:  438 QTC Calculation: 473 R Axis:   96 Text Interpretation:  Sinus rhythm Borderline intraventricular conduction  delay Borderline T  wave abnormalities Abnormal ekg Since last tracing  Nonspecific T wave abnormality NOW PRESENT Confirmed by Hyacinth Meeker  MD, Makalyn Lennox  [phone]) on 08/29/2014 11:46:19 PM      MDM   Final diagnoses:  Seizure  Subungual infection of finger of left hand   Initially the patient refused to talk to anybody, eventually she was able to talk and stated that she had another seizure, she is very tearful feeling as though people did not take her seriously, she denies taking her medication in over one week because she is out, she has no recollection of what the seizure looked like or what happened, she requests her prescription medication stating specifically that Klonopin is the only medication that prevents her from having a seizure. Her vital signs are unremarkable, she does endorse having a small amount of dizziness today, states that she has had some nausea with eating but denies abdominal pain chest pain or shortness of breath. Check urinalysis and urine pregnancy, basic metabolic panel, and a dose of her home medications.  Meds given in ED:  Medications  clonazePAM (KLONOPIN) tablet 1 mg (1 mg Oral Given 08/30/14 0033)  lidocaine (PF) (XYLOCAINE) 1 % injection 5 mL (5 mLs Intradermal Given 08/30/14 0150)  lidocaine (PF) (XYLOCAINE) 1 % injection 5 mL (5 mLs Infiltration Given 08/30/14 0157)  oxyCODONE-acetaminophen (PERCOCET/ROXICET) 5-325 MG per tablet 2 tablet (2 tablets Oral Given 08/30/14 0251)    Discharge Medication List as of 08/30/2014  2:19 AM    START taking these medications   Details  clonazePAM (KLONOPIN) 0.5 MG tablet Take 1 tablet (0.5 mg total) by mouth 2 (two) times daily as needed for anxiety., Starting 08/30/2014, Until Discontinued, Print        I personally performed the services described in this documentation, which was scribed in my presence. The recorded information has been reviewed and is accurate.  No more seizures while she was in the emergency department, medications given as  above, no signs of paronychia or lymphangitis - no celluoitis, normal ROM of the joints. Abscess drained by procedure - pt expresses understanding of the indications for return.  Clonazepam is the only mediation that the pt states she needs filled.     Eber Hong, MD 08/30/14 [phone]

## 2014-08-30 LAB — BASIC METABOLIC PANEL
Anion gap: 10 (ref 5–15)
BUN: 8 mg/dL (ref 6–23)
CHLORIDE: 110 mmol/L (ref 96–112)
CO2: 20 mmol/L (ref 19–32)
CREATININE: 1.19 mg/dL — AB (ref 0.50–1.10)
Calcium: 9.6 mg/dL (ref 8.4–10.5)
GFR calc non Af Amer: 62 mL/min — ABNORMAL LOW (ref >90)
GFR, EST AFRICAN AMERICAN: 72 mL/min — AB (ref >90)
Glucose, Bld: 104 mg/dL — ABNORMAL HIGH (ref 70–99)
Potassium: 3.1 mmol/L — ABNORMAL LOW (ref 3.5–5.1)
Sodium: 140 mmol/L (ref 135–145)

## 2014-08-30 MED ORDER — CLONAZEPAM 0.5 MG PO TABS: 0.5000 mg | ORAL_TABLET | Freq: Two times a day (BID) | ORAL | Status: DC | PRN

## 2014-08-30 MED ORDER — NAPROXEN 500 MG PO TABS: 500.0000 mg | ORAL_TABLET | Freq: Two times a day (BID) | ORAL | Status: DC

## 2014-08-30 MED ORDER — OXYCODONE-ACETAMINOPHEN 5-325 MG PO TABS
2.0000 | ORAL_TABLET | Freq: Once | ORAL | Status: AC
  Administered 2014-08-30: 2 via ORAL
  Filled 2014-08-30: qty 2

## 2014-08-30 MED ORDER — CEPHALEXIN 500 MG PO CAPS: 500.0000 mg | ORAL_CAPSULE | Freq: Four times a day (QID) | ORAL | Status: DC

## 2014-08-30 MED ORDER — LAMOTRIGINE 25 MG PO TABS: ORAL_TABLET | ORAL | Status: DC

## 2014-08-30 MED ORDER — LIDOCAINE HCL (PF) 1 % IJ SOLN
5.0000 mL | Freq: Once | INTRAMUSCULAR | Status: AC
  Administered 2014-08-30: 5 mL via INTRADERMAL
  Filled 2014-08-30: qty 5

## 2014-08-30 MED ORDER — ZONISAMIDE 100 MG PO CAPS: 800.0000 mg | ORAL_CAPSULE | Freq: Every day | ORAL | Status: DC

## 2014-08-30 MED ORDER — LIDOCAINE HCL (PF) 1 % IJ SOLN
5.0000 mL | Freq: Once | INTRAMUSCULAR | Status: AC
  Administered 2014-08-30: 5 mL
  Filled 2014-08-30: qty 5

## 2014-08-30 MED ORDER — OXYCODONE-ACETAMINOPHEN 5-325 MG PO TABS: 1.0000 | ORAL_TABLET | ORAL | Status: DC | PRN

## 2014-08-30 NOTE — Discharge Instructions (Signed)
Please call your doctor for a followup appointment within 24-48 hours. When you talk to your doctor please let them know that you were seen in the emergency department and have them acquire all of your records so that they can discuss the findings with you and formulate a treatment plan to fully care for your new and ongoing problems. ° °

## 2014-08-30 NOTE — ED Notes (Signed)
Patient stated understanding for discharge instructions. She requested a pain medication for her finger and a prescription to take home for her finger. The MD added an additional prescription for each due to the procedure preformed today and the antibiotic therapy recommended. Teach back method was utilized for dressing instructions to the wound.

## 2014-09-01 LAB — LAMOTRIGINE LEVEL: Lamotrigine Lvl: 2.8 ug/mL (ref 2.0–20.0)

## 2014-11-01 ENCOUNTER — Emergency Department (HOSPITAL_COMMUNITY)
Admission: EM | Admit: 2014-11-01 | Discharge: 2014-11-01 | Disposition: A | Discharge status: Home / Self Care | Payer: Medicaid Other | Attending: Emergency Medicine | Admitting: Emergency Medicine

## 2014-11-01 DIAGNOSIS — F1210 Cannabis abuse, uncomplicated: Secondary | ICD-10-CM | POA: Diagnosis not present

## 2014-11-01 DIAGNOSIS — F410 Panic disorder [episodic paroxysmal anxiety] without agoraphobia: Secondary | ICD-10-CM | POA: Diagnosis not present

## 2014-11-01 DIAGNOSIS — Z79899 Other long term (current) drug therapy: Secondary | ICD-10-CM | POA: Insufficient documentation

## 2014-11-01 DIAGNOSIS — Z3202 Encounter for pregnancy test, result negative: Secondary | ICD-10-CM | POA: Insufficient documentation

## 2014-11-01 DIAGNOSIS — G40909 Epilepsy, unspecified, not intractable, without status epilepticus: Secondary | ICD-10-CM | POA: Insufficient documentation

## 2014-11-01 DIAGNOSIS — R569 Unspecified convulsions: Secondary | ICD-10-CM | POA: Diagnosis present

## 2014-11-01 LAB — URINALYSIS, ROUTINE W REFLEX MICROSCOPIC
Bilirubin Urine: NEGATIVE
Glucose, UA: NEGATIVE mg/dL
Hgb urine dipstick: NEGATIVE
Ketones, ur: NEGATIVE mg/dL
Leukocytes, UA: NEGATIVE
Nitrite: NEGATIVE
Protein, ur: NEGATIVE mg/dL
Specific Gravity, Urine: 1.024 (ref 1.005–1.030)
Urobilinogen, UA: 1 mg/dL (ref 0.0–1.0)
pH: 7 (ref 5.0–8.0)

## 2014-11-01 LAB — I-STAT CHEM 8, ED
BUN: 15 mg/dL (ref 6–20)
CALCIUM ION: 1.28 mmol/L — AB (ref 1.12–1.23)
CHLORIDE: 109 mmol/L (ref 101–111)
Creatinine, Ser: 1 mg/dL (ref 0.44–1.00)
GLUCOSE: 77 mg/dL (ref 65–99)
HEMATOCRIT: 40 % (ref 36.0–46.0)
Hemoglobin: 13.6 g/dL (ref 12.0–15.0)
Potassium: 3.7 mmol/L (ref 3.5–5.1)
Sodium: 141 mmol/L (ref 135–145)
TCO2: 18 mmol/L (ref 0–100)

## 2014-11-01 LAB — URINE MICROSCOPIC-ADD ON

## 2014-11-01 LAB — RAPID URINE DRUG SCREEN, HOSP PERFORMED
Amphetamines: NOT DETECTED
Barbiturates: NOT DETECTED
Benzodiazepines: NOT DETECTED
Cocaine: NOT DETECTED
Opiates: NOT DETECTED
Tetrahydrocannabinol: POSITIVE — AB

## 2014-11-01 LAB — CBG MONITORING, ED: Glucose-Capillary: 79 mg/dL (ref 65–99)

## 2014-11-01 LAB — ETHANOL: Alcohol, Ethyl (B): <5 mg/dL (ref <5)

## 2014-11-01 LAB — PREGNANCY, URINE: Preg Test, Ur: NEGATIVE

## 2014-11-01 MED ORDER — ACETAMINOPHEN 325 MG PO TABS
650.0000 mg | ORAL_TABLET | Freq: Once | ORAL | Status: AC
  Administered 2014-11-01: 650 mg via ORAL
  Filled 2014-11-01: qty 2

## 2014-11-01 MED ORDER — ZONISAMIDE 100 MG PO CAPS: 800.0000 mg | ORAL_CAPSULE | Freq: Every day | ORAL | Status: DC

## 2014-11-01 MED ORDER — ZONISAMIDE 100 MG PO CAPS
800.0000 mg | ORAL_CAPSULE | Freq: Every day | ORAL | Status: DC
  Administered 2014-11-01: 800 mg via ORAL
  Filled 2014-11-01: qty 8

## 2014-11-01 MED ORDER — SODIUM CHLORIDE 0.9 % IV BOLUS (SEPSIS)
1000.0000 mL | Freq: Once | INTRAVENOUS | Status: AC
  Administered 2014-11-01: 1000 mL via INTRAVENOUS

## 2014-11-01 NOTE — ED Provider Notes (Deleted)
CSN: [id number]     Arrival date & time 11/01/14  1448 History   First MD Initiated Contact with Patient 11/01/14 1510     Chief Complaint  Patient presents with  . Seizures     (Consider location/radiation/quality/duration/timing/severity/associated sxs/prior Treatment) HPI  Past Medical History  Diagnosis Date  . JME (juvenile myoclonic epilepsy)     Age of 56  . Generalized headaches   . Panic attack   . Anxiety   . Seizures    Past Surgical History  Procedure Laterality Date  . Tubal ligation     Family History  Problem Relation Age of Onset  . Healthy Mother   . Other Father   . Asthma Brother   . Healthy Son   . Healthy Daughter    History  Substance Use Topics  . Smoking status: Never Smoker   . Smokeless tobacco: Never Used  . Alcohol Use: Yes   OB History    Gravida Para Term Preterm AB TAB SAB Ectopic Multiple Living   2 1 1  1           Review of Systems    Allergies  Lipitor; Food allergy formula; and Hydrocodone  Home Medications   Prior to Admission medications   Medication Sig Start Date End Date Taking? Authorizing Provider  aspirin-acetaminophen-caffeine (EXCEDRIN MIGRAINE) [phone] MG per tablet Take 2 tablets by mouth every 6 (six) hours as needed for headache.   Yes Historical Provider, MD  clonazePAM (KLONOPIN) 0.5 MG tablet Take 1 tablet (0.5 mg total) by mouth 2 (two) times daily as needed for anxiety. 08/30/14  Yes Eber Hong, MD  lamoTRIgine (LAMICTAL) 100 MG tablet Take 100 mg by mouth 2 (two) times daily.   Yes Historical Provider, MD  zonisamide (ZONEGRAN) 100 MG capsule Take 8 capsules (800 mg total) by mouth daily. 08/30/14  Yes Eber Hong, MD  Alum & Mag Hydroxide-Simeth (MAGIC MOUTHWASH W/LIDOCAINE) SOLN Take 5 mLs by mouth 3 (three) times daily. Patient not taking: Reported on 07/14/2014 03/16/14   Maxine Glenn, MD  cephALEXin (KEFLEX) 500 MG capsule Take 1 capsule (500 mg total) by mouth 4 (four) times daily. Patient not  taking: Reported on 11/01/2014 08/30/14   Eber Hong, MD  diazepam (DIASTAT ACUDIAL) 10 MG GEL Place 10 mg rectally once. Patient not taking: Reported on 11/01/2014 07/14/14   Shaune Pollack, MD  lamoTRIgine (LAMICTAL) 25 MG tablet Take 1 tab twice daily and increase by 25mg  (1 tab) each week to goal of 100mg  twice daily.  Titration schedule provided to patient. Patient not taking: Reported on 11/01/2014 08/30/14   Eber Hong, MD  LORazepam (ATIVAN) 0.5 MG tablet Take 1 tablet (0.5 mg total) by mouth 3 (three) times daily as needed for anxiety. Patient not taking: Reported on 07/14/2014 12/17/13   Renne Crigler, PA-C  naproxen (NAPROSYN) 500 MG tablet Take 1 tablet (500 mg total) by mouth 2 (two) times daily with a meal. Patient not taking: Reported on 11/01/2014 08/30/14   Eber Hong, MD  oxyCODONE-acetaminophen (PERCOCET/ROXICET) 5-325 MG per tablet Take 1 tablet by mouth every 4 (four) hours as needed for severe pain. May take 2 tablets PO q 6 hours for severe pain - Do not take with Tylenol as this tablet already contains tylenol Patient not taking: Reported on 11/01/2014 08/30/14   Eber Hong, MD   BP 109/67 mmHg  Pulse 62  Temp(Src) 98.7 F (37.1 C) (Oral)  Resp 12  SpO2 100% Physical Exam  ED Course  Procedures (including critical care time) Labs Review Labs Reviewed  I-STAT CHEM 8, ED - Abnormal; Notable for the following:    Calcium, Ion 1.28 (*)    All other components within normal limits  PREGNANCY, URINE  URINALYSIS, ROUTINE W REFLEX MICROSCOPIC (NOT AT [facility])  URINE RAPID DRUG SCREEN, HOSP PERFORMED  ETHANOL  CBG MONITORING, ED    Imaging Review No results found.   EKG Interpretation None      MDM   Final diagnoses:  None    Please delete--duplicate note    Doug Sou, MD 11/02/14 0006

## 2014-11-01 NOTE — Discharge Instructions (Signed)
Return here as needed.  Follow-up with your neurologist as soon as possible °

## 2014-11-01 NOTE — ED Provider Notes (Signed)
Patient reportedly had seizure today. Witnessed by her child who is not here. Barbara Hodges complains of diffuse headache. No other associated symptoms. No treatment prior to coming here. She admits to running out of zonegran 2 days ago. Patient sleepy Glasgow Coma Score 15 moves all extremities.  Doug Sou, MD 11/01/14 [phone]

## 2014-11-01 NOTE — ED Notes (Signed)
Bed: HM09 Expected date:  Expected time:  Means of arrival:  Comments: EMS- 28yo F, seizure?

## 2014-11-01 NOTE — ED Notes (Signed)
Pt ambulated in hall w/no assist

## 2014-11-01 NOTE — ED Provider Notes (Signed)
CSN: [id number]     Arrival date & time 11/01/14  1448 History   First MD Initiated Contact with Patient 11/01/14 1510     Chief Complaint  Patient presents with  . Seizures     (Consider location/radiation/quality/duration/timing/severity/associated sxs/prior Treatment) HPI Patient presents to the emergency department with a possible seizure that occurred today.  The patient did not have a witnessed seizure by anyone but her son states she was hard to wake up, so therefore he called 911.  He was told that if she is hard to arouse that she may have had a seizure, so that is why he called 911.  The patient states that she is not sure what happened, but she did say she partied heavily yesterday for someone's birthday.  The patient states that she has not taken her Zonegran the last few days that she has run out.  The patient states that she has been taking her other medications.  Patient denies fever, nausea, vomiting, weakness, dizziness, headache, blurred vision, back pain, neck pain, chest pain, shortness of breath, abdominal pain, near syncope, lightheadedness or syncope Past Medical History  Diagnosis Date  . JME (juvenile myoclonic epilepsy)     Age of 65  . Generalized headaches   . Panic attack   . Anxiety   . Seizures    Past Surgical History  Procedure Laterality Date  . Tubal ligation     Family History  Problem Relation Age of Onset  . Healthy Mother   . Other Father   . Asthma Brother   . Healthy Son   . Healthy Daughter    History  Substance Use Topics  . Smoking status: Never Smoker   . Smokeless tobacco: Never Used  . Alcohol Use: Yes   OB History    Gravida Para Term Preterm AB TAB SAB Ectopic Multiple Living   Review of Systems   All other systems negative except as documented in the HPI. All pertinent positives and negatives as reviewed in the HPI. Allergies  Lipitor; Food allergy formula; and Hydrocodone  Home Medications    Prior to Admission medications   Medication Sig Start Date End Date Taking? Authorizing Provider  aspirin-acetaminophen-caffeine (EXCEDRIN MIGRAINE) [phone] MG per tablet Take 2 tablets by mouth every 6 (six) hours as needed for headache.   Yes Historical Provider, MD  clonazePAM (KLONOPIN) 0.5 MG tablet Take 1 tablet (0.5 mg total) by mouth 2 (two) times daily as needed for anxiety. 08/30/14  Yes Eber Hong, MD  lamoTRIgine (LAMICTAL) 100 MG tablet Take 100 mg by mouth 2 (two) times daily.   Yes Historical Provider, MD  zonisamide (ZONEGRAN) 100 MG capsule Take 8 capsules (800 mg total) by mouth daily. 08/30/14  Yes Eber Hong, MD  Alum & Mag Hydroxide-Simeth (MAGIC MOUTHWASH W/LIDOCAINE) SOLN Take 5 mLs by mouth 3 (three) times daily. Patient not taking: Reported on 07/14/2014 03/16/14   Maxine Glenn, MD  cephALEXin (KEFLEX) 500 MG capsule Take 1 capsule (500 mg total) by mouth 4 (four) times daily. Patient not taking: Reported on 11/01/2014 08/30/14   Eber Hong, MD  diazepam (DIASTAT ACUDIAL) 10 MG GEL Place 10 mg rectally once. Patient not taking: Reported on 11/01/2014 07/14/14   Shaune Pollack, MD  lamoTRIgine (LAMICTAL) 25 MG tablet Take 1 tab twice daily and increase by  (1 tab) each week to goal of  twice daily.  Titration schedule provided  to patient. Patient not taking: Reported on 11/01/2014 08/30/14   Eber Hong, MD  LORazepam (ATIVAN) 0.5 MG tablet Take 1 tablet (0.5 mg total) by mouth 3 (three) times daily as needed for anxiety. Patient not taking: Reported on 07/14/2014 12/17/13   Renne Crigler, PA-C  naproxen (NAPROSYN) 500 MG tablet Take 1 tablet (500 mg total) by mouth 2 (two) times daily with a meal. Patient not taking: Reported on 11/01/2014 08/30/14   Eber Hong, MD  oxyCODONE-acetaminophen (PERCOCET/ROXICET) 5-325 MG per tablet Take 1 tablet by mouth every 4 (four) hours as needed for severe pain. May take 2 tablets PO q 6 hours for severe pain - Do not take with  Tylenol as this tablet already contains tylenol Patient not taking: Reported on 11/01/2014 08/30/14   Eber Hong, MD   BP 109/67 mmHg  Pulse 62  Temp(Src) 98.7 F (37.1 C) (Oral)  Resp 12  SpO2 100% Physical Exam  Constitutional: She is oriented to person, place, and time. She appears well-developed and well-nourished. No distress.  HENT:  Head: Normocephalic and atraumatic.  Mouth/Throat: Oropharynx is clear and moist.  Eyes: Pupils are equal, round, and reactive to light.  Neck: Normal range of motion. Neck supple.  Cardiovascular: Normal rate, regular rhythm and normal heart sounds.  Exam reveals no gallop and no friction rub.   No murmur heard. Pulmonary/Chest: Effort normal and breath sounds normal. No respiratory distress.  Musculoskeletal: She exhibits no edema.  Neurological: She is alert and oriented to person, place, and time. She exhibits normal muscle tone. Coordination normal.  Skin: Skin is warm and dry.  Psychiatric: She has a normal mood and affect. Her behavior is normal.  Nursing note and vitals reviewed.   ED Course  Procedures (including critical care time) Labs Review Labs Reviewed  URINALYSIS, ROUTINE W REFLEX MICROSCOPIC (NOT AT [facility]) - Abnormal; Notable for the following:    APPearance TURBID (*)    All other components within normal limits  URINE RAPID DRUG SCREEN, HOSP PERFORMED - Abnormal; Notable for the following:    Tetrahydrocannabinol POSITIVE (*)    All other components within normal limits  I-STAT CHEM 8, ED - Abnormal; Notable for the following:    Calcium, Ion 1.28 (*)    All other components within normal limits  PREGNANCY, URINE  ETHANOL  URINE MICROSCOPIC-ADD ON  CBG MONITORING, ED    Patient is advised to return here as needed and also advised her to follow-up with her neurologist.  The patient was given her Zonegran here in the emergency department.  She will also get a refill of this until she can follow-up.  Patient is advised to  increase her fluid intake     Charlestine Night, PA-C 11/01/14 1733  Doug Sou, MD 11/02/14 [id number]

## 2014-11-01 NOTE — ED Notes (Signed)
Pt from home and son said that pt has not been up and was thinking pt had a seizure. Did not see any activity, did not void on self, alert but drowsy. HR 50 and pt states that she is taking meds as prescribed.

## 2014-11-09 ENCOUNTER — Emergency Department (INDEPENDENT_AMBULATORY_CARE_PROVIDER_SITE_OTHER)
Admission: EM | Admit: 2014-11-09 | Discharge: 2014-11-09 | Disposition: A | Discharge status: Home / Self Care | Payer: Medicaid Other | Source: Home / Self Care | Attending: Family Medicine | Admitting: Family Medicine

## 2014-11-09 ENCOUNTER — Encounter (HOSPITAL_COMMUNITY): Payer: Self-pay | Admitting: Emergency Medicine

## 2014-11-09 DIAGNOSIS — G40909 Epilepsy, unspecified, not intractable, without status epilepticus: Secondary | ICD-10-CM

## 2014-11-09 MED ORDER — LAMOTRIGINE 100 MG PO TABS: 200.0000 mg | ORAL_TABLET | Freq: Every day | ORAL | Status: DC

## 2014-11-09 MED ORDER — CLONAZEPAM 0.5 MG PO TABS: 0.5000 mg | ORAL_TABLET | Freq: Two times a day (BID) | ORAL | Status: DC | PRN

## 2014-11-09 NOTE — ED Notes (Signed)
Patient requesting refill of medications.  Patient did get refill of zonegram when seen in the ed.

## 2014-11-09 NOTE — ED Notes (Signed)
Patient is being seen in the same room as child, same provider.

## 2014-11-09 NOTE — Discharge Instructions (Signed)
See your doctor as planned. °

## 2014-11-09 NOTE — ED Provider Notes (Signed)
CSN: [id number]     Arrival date & time 11/09/14  1542 History   First MD Initiated Contact with Patient 11/09/14 1622     Chief Complaint  Patient presents with  . Medication Refill   (Consider location/radiation/quality/duration/timing/severity/associated sxs/prior Treatment) Patient is a 29 y.o. female presenting with general illness. The history is provided by the patient.  Illness Location:  Pt with seizures reports out of meds, has f/u appt in 2 weeks with neurologist. Severity:  Mild Chronicity:  Chronic Context:  Needs 2 meds refilled   Past Medical History  Diagnosis Date  . JME (juvenile myoclonic epilepsy)     Age of 34  . Generalized headaches   . Panic attack   . Anxiety   . Seizures    Past Surgical History  Procedure Laterality Date  . Tubal ligation     Family History  Problem Relation Age of Onset  . Healthy Mother   . Other Father   . Asthma Brother   . Healthy Son   . Healthy Daughter    History  Substance Use Topics  . Smoking status: Never Smoker   . Smokeless tobacco: Never Used  . Alcohol Use: Yes   OB History    Gravida Para Term Preterm AB TAB SAB Ectopic Multiple Living   2 1 1  1           Review of Systems  Constitutional: Negative.   Neurological: Negative for seizures.    Allergies  Lipitor; Food allergy formula; and Hydrocodone  Home Medications   Prior to Admission medications   Medication Sig Start Date End Date Taking? Authorizing Provider  Alum & Mag Hydroxide-Simeth (MAGIC MOUTHWASH W/LIDOCAINE) SOLN Take 5 mLs by mouth 3 (three) times daily. Patient not taking: Reported on 07/14/2014 03/16/14   Maxine Glenn, MD  aspirin-acetaminophen-caffeine [facility] MIGRAINE) [phone] MG per tablet Take 2 tablets by mouth every 6 (six) hours as needed for headache.    Historical Provider, MD  cephALEXin (KEFLEX) 500 MG capsule Take 1 capsule (500 mg total) by mouth 4 (four) times daily. Patient not taking: Reported on 11/01/2014  08/30/14   Eber Hong, MD  clonazePAM (KLONOPIN) 0.5 MG tablet Take 1 tablet (0.5 mg total) by mouth 2 (two) times daily as needed for anxiety. 11/09/14   Linna Hoff, MD  diazepam (DIASTAT ACUDIAL) 10 MG GEL Place 10 mg rectally once. Patient not taking: Reported on 11/01/2014 07/14/14   Shaune Pollack, MD  lamoTRIgine (LAMICTAL) 100 MG tablet Take 2 tablets (200 mg total) by mouth daily. 11/09/14   Linna Hoff, MD  LORazepam (ATIVAN) 0.5 MG tablet Take 1 tablet (0.5 mg total) by mouth 3 (three) times daily as needed for anxiety. Patient not taking: Reported on 07/14/2014 12/17/13   Renne Crigler, PA-C  naproxen (NAPROSYN) 500 MG tablet Take 1 tablet (500 mg total) by mouth 2 (two) times daily with a meal. Patient not taking: Reported on 11/01/2014 08/30/14   Eber Hong, MD  oxyCODONE-acetaminophen (PERCOCET/ROXICET) 5-325 MG per tablet Take 1 tablet by mouth every 4 (four) hours as needed for severe pain. May take 2 tablets PO q 6 hours for severe pain - Do not take with Tylenol as this tablet already contains tylenol Patient not taking: Reported on 11/01/2014 08/30/14   Eber Hong, MD  zonisamide (ZONEGRAN) 100 MG capsule Take 8 capsules (800 mg total) by mouth daily. 11/01/14   Christopher Lawyer, PA-C   BP 97/62 mmHg  Pulse 65  Temp(Src) 98.1  F (36.7 C) (Oral)  Resp 12  SpO2 100% Physical Exam  Constitutional: She is oriented to person, place, and time. She appears well-developed and well-nourished. No distress.  HENT:  Head: Normocephalic.  Eyes: Pupils are equal, round, and reactive to light.  Neck: Normal range of motion. Neck supple.  Neurological: She is alert and oriented to person, place, and time.  Skin: Skin is warm and dry.  Nursing note and vitals reviewed.   ED Course  Procedures (including critical care time) Labs Review Labs Reviewed - No data to display  Imaging Review No results found.   MDM   1. Seizure disorder        Linna Hoff, MD 11/09/14  1754

## 2014-11-09 NOTE — ED Notes (Signed)
Delay in discharging patient .  Trying to coordinate services for child that is being seen with this mother.

## 2014-11-28 ENCOUNTER — Emergency Department (HOSPITAL_COMMUNITY)
Admission: EM | Admit: 2014-11-28 | Discharge: 2014-11-28 | Disposition: A | Discharge status: Home / Self Care | Payer: Medicaid Other | Attending: Emergency Medicine | Admitting: Emergency Medicine

## 2014-11-28 ENCOUNTER — Encounter (HOSPITAL_COMMUNITY): Payer: Self-pay | Admitting: Emergency Medicine

## 2014-11-28 DIAGNOSIS — G40909 Epilepsy, unspecified, not intractable, without status epilepticus: Secondary | ICD-10-CM | POA: Insufficient documentation

## 2014-11-28 DIAGNOSIS — R569 Unspecified convulsions: Secondary | ICD-10-CM

## 2014-11-28 DIAGNOSIS — Z79899 Other long term (current) drug therapy: Secondary | ICD-10-CM | POA: Diagnosis not present

## 2014-11-28 DIAGNOSIS — F410 Panic disorder [episodic paroxysmal anxiety] without agoraphobia: Secondary | ICD-10-CM | POA: Diagnosis not present

## 2014-11-28 LAB — I-STAT CHEM 8, ED
BUN: 8 mg/dL (ref 6–20)
CHLORIDE: 112 mmol/L — AB (ref 101–111)
Calcium, Ion: 1.22 mmol/L (ref 1.12–1.23)
Creatinine, Ser: 0.9 mg/dL (ref 0.44–1.00)
GLUCOSE: 80 mg/dL (ref 65–99)
HEMATOCRIT: 37 % (ref 36.0–46.0)
Hemoglobin: 12.6 g/dL (ref 12.0–15.0)
Potassium: 3.8 mmol/L (ref 3.5–5.1)
Sodium: 139 mmol/L (ref 135–145)
TCO2: 16 mmol/L (ref 0–100)

## 2014-11-28 LAB — CBG MONITORING, ED: Glucose-Capillary: 90 mg/dL (ref 65–99)

## 2014-11-28 MED ORDER — LAMOTRIGINE 200 MG PO TABS
200.0000 mg | ORAL_TABLET | Freq: Every day | ORAL | Status: DC
  Administered 2014-11-28: 200 mg via ORAL
  Filled 2014-11-28: qty 1

## 2014-11-28 MED ORDER — ZONISAMIDE 100 MG PO CAPS
800.0000 mg | ORAL_CAPSULE | Freq: Every day | ORAL | Status: DC
  Administered 2014-11-28: 800 mg via ORAL
  Filled 2014-11-28: qty 8

## 2014-11-28 MED ORDER — LAMOTRIGINE 100 MG PO TABS: 200.0000 mg | ORAL_TABLET | Freq: Every day | ORAL | Status: DC

## 2014-11-28 MED ORDER — CLONAZEPAM 0.5 MG PO TABS
0.5000 mg | ORAL_TABLET | Freq: Two times a day (BID) | ORAL | Status: DC | PRN
  Administered 2014-11-28: 0.5 mg via ORAL
  Filled 2014-11-28: qty 1

## 2014-11-28 MED ORDER — CLONAZEPAM 0.5 MG PO TABS: 0.5000 mg | ORAL_TABLET | Freq: Two times a day (BID) | ORAL | Status: DC | PRN

## 2014-11-28 MED ORDER — ZONISAMIDE 100 MG PO CAPS: 800.0000 mg | ORAL_CAPSULE | Freq: Every day | ORAL | Status: DC

## 2014-11-28 NOTE — ED Provider Notes (Signed)
CSN: [id number]     Arrival date & time 11/28/14  1924 History   First MD Initiated Contact with Patient 11/28/14 2025     Chief Complaint  Patient presents with  . Seizures     (Consider location/radiation/quality/duration/timing/severity/associated sxs/prior Treatment) HPI Comments: Patient presents to the emergency department with chief complaint of seizure. She has a history of seizures. She states that she has been out of her seizure medication for the past 2 days. Her last dose was the night before last. She does not have a doctor. She states that she had a seizure today. States that the seizure lasted 3-5 minutes. The patient was postictal. She states that she still feels very tired. There are no aggravating or alleviating factors.  The history is provided by the patient. No language interpreter was used.    Past Medical History  Diagnosis Date  . JME (juvenile myoclonic epilepsy)     Age of 29  . Generalized headaches   . Panic attack   . Anxiety   . Seizures    Past Surgical History  Procedure Laterality Date  . Tubal ligation     Family History  Problem Relation Age of Onset  . Healthy Mother   . Other Father   . Asthma Brother   . Healthy Son   . Healthy Daughter    History  Substance Use Topics  . Smoking status: Never Smoker   . Smokeless tobacco: Never Used  . Alcohol Use: Yes   OB History    Gravida Para Term Preterm AB TAB SAB Ectopic Multiple Living   2 1 1  1           Review of Systems  Constitutional: Negative for fever and chills.  Respiratory: Negative for shortness of breath.   Cardiovascular: Negative for chest pain.  Gastrointestinal: Negative for nausea, vomiting, diarrhea and constipation.  Genitourinary: Negative for dysuria.  Neurological: Positive for seizures.  All other systems reviewed and are negative.     Allergies  Lipitor; Food allergy formula; and Hydrocodone  Home Medications   Prior to Admission medications    Medication Sig Start Date End Date Taking? Authorizing Provider  Alum & Mag Hydroxide-Simeth (MAGIC MOUTHWASH W/LIDOCAINE) SOLN Take 5 mLs by mouth 3 (three) times daily. Patient not taking: Reported on 07/14/2014 03/16/14   Maxine GlennAnn Batista, MD  aspirin-acetaminophen-caffeine [facility](EXCEDRIN MIGRAINE) [phone] MG per tablet Take 2 tablets by mouth every 6 (six) hours as needed for headache.    Historical Provider, MD  cephALEXin (KEFLEX) 500 MG capsule Take 1 capsule (500 mg total) by mouth 4 (four) times daily. Patient not taking: Reported on 11/01/2014 08/30/14   Eber HongBrian Miller, MD  clonazePAM (KLONOPIN) 0.5 MG tablet Take 1 tablet (0.5 mg total) by mouth 2 (two) times daily as needed for anxiety. 11/09/14   Linna HoffJames D Kindl, MD  diazepam (DIASTAT ACUDIAL) 10 MG GEL Place 10 mg rectally once. Patient not taking: Reported on 11/01/2014 07/14/14   Shaune Pollackameron Isaacs, MD  lamoTRIgine (LAMICTAL) 100 MG tablet Take 2 tablets (200 mg total) by mouth daily. 11/09/14   Linna HoffJames D Kindl, MD  LORazepam (ATIVAN) 0.5 MG tablet Take 1 tablet (0.5 mg total) by mouth 3 (three) times daily as needed for anxiety. Patient not taking: Reported on 07/14/2014 12/17/13   Renne CriglerJoshua Geiple, PA-C  naproxen (NAPROSYN) 500 MG tablet Take 1 tablet (500 mg total) by mouth 2 (two) times daily with a meal. Patient not taking: Reported on 11/01/2014 08/30/14   Arlys JohnBrian  Miller, MD  oxyCODOHyacinth Meekeracetaminophen (PERCOCET/ROXICET) 5-325 MG per tablet Take 1 tablet by mouth every 4 (four) hours as needed for severe pain. May take 2 tablets PO q 6 hours for severe pain - Do not take with Tylenol as this tablet already contains tylenol Patient not taking: Reported on 11/01/2014 08/30/14   Eber Hong, MD  zonisamide (ZONEGRAN) 100 MG capsule Take 8 capsules (800 mg total) by mouth daily. 11/01/14   Christopher Lawyer, PA-C   BP 104/69 mmHg  Pulse 62  Temp(Src) 98.2 F (36.8 C) (Oral)  Resp 14  SpO2 100%  LMP 11/27/2014 (Exact Date) Physical Exam  Constitutional: She  is oriented to person, place, and time. She appears well-developed and well-nourished.  HENT:  Head: Normocephalic and atraumatic.  Eyes: Conjunctivae and EOM are normal. Pupils are equal, round, and reactive to light.  Neck: Normal range of motion. Neck supple.  Cardiovascular: Normal rate and regular rhythm.  Exam reveals no gallop and no friction rub.   No murmur heard. Pulmonary/Chest: Effort normal and breath sounds normal. No respiratory distress. She has no wheezes. She has no rales. She exhibits no tenderness.  Abdominal: Soft. Bowel sounds are normal. She exhibits no distension and no mass. There is no tenderness. There is no rebound and no guarding.  Musculoskeletal: Normal range of motion. She exhibits no edema or tenderness.  Neurological: She is alert and oriented to person, place, and time.  Skin: Skin is warm and dry.  Psychiatric: She has a normal mood and affect. Her behavior is normal. Judgment and thought content normal.  Nursing note and vitals reviewed.   ED Course  Procedures (including critical care time) Results for orders placed or performed during the hospital encounter of 11/28/14  CBG monitoring, ED  Result Value Ref Range   Glucose-Capillary 90 65 - 99 mg/dL  I-stat chem 8, ed  Result Value Ref Range   Sodium 139 135 - 145 mmol/L   Potassium 3.8 3.5 - 5.1 mmol/L   Chloride 112 (H) 101 - 111 mmol/L   BUN 8 6 - 20 mg/dL   Creatinine, Ser 1.61 0.44 - 1.00 mg/dL   Glucose, Bld 80 65 - 99 mg/dL   Calcium, Ion 0.96 0.45 - 1.23 mmol/L   TCO2 16 0 - 100 mmol/L   Hemoglobin 12.6 12.0 - 15.0 g/dL   HCT 40.9 81.1 - 91.4 %   No results found.     EKG Interpretation None      ED ECG REPORT  I personally interpreted this EKG   Date: 11/28/2014   Rate: 58  Rhythm: sinus bradycardia  QRS Axis: normal  Intervals: normal  ST/T Wave abnormalities: normal  Conduction Disutrbances:none  Narrative Interpretation:   Old EKG Reviewed: unchanged    MDM    Final diagnoses:  Seizure    Patient with seizures. She had a another seizure today. She is noncompliant on her medications. She states that she does not have anyone to refill her prescriptions. The seizure today was like her typical seizures. She is now alert and oriented. Will check chemistry panel, EKG, and CBG. Will reassess.  11:03 PM Patient is feeling well.  Discussed with Dr. Lynelle Doctor, who agrees with discharge plan.  Seizure likely 2/2 medication non-compliance.  Patient encouraged to follow-up with neurology.  DC to home.    Roxy Horseman, PA-C 11/28/14 7829  Linwood Dibbles, MD 11/30/14 [phone]

## 2014-11-28 NOTE — ED Notes (Signed)
Family states that pt took the last of her seizure medication last night  Called her today around noon and she said she was not feeling well  States when he got home from work today a little after 5 she had a seizure  Family member states they witness the seizure and it lasted approximately 3 to 5 minutes  Pt remains lethargic but alert and oriented x 3

## 2014-11-28 NOTE — Discharge Instructions (Signed)

## 2015-05-30 ENCOUNTER — Encounter (HOSPITAL_COMMUNITY): Payer: Self-pay | Admitting: Emergency Medicine

## 2015-05-30 ENCOUNTER — Emergency Department (HOSPITAL_COMMUNITY)
Admission: EM | Admit: 2015-05-30 | Discharge: 2015-05-30 | Discharge status: Against Medical Advice | Payer: Medicaid Other | Attending: Emergency Medicine | Admitting: Emergency Medicine

## 2015-05-30 DIAGNOSIS — R569 Unspecified convulsions: Secondary | ICD-10-CM | POA: Diagnosis not present

## 2015-05-30 DIAGNOSIS — R51 Headache: Secondary | ICD-10-CM | POA: Insufficient documentation

## 2015-05-30 LAB — BASIC METABOLIC PANEL
Anion gap: 7 (ref 5–15)
BUN: 12 mg/dL (ref 6–20)
CO2: 25 mmol/L (ref 22–32)
Calcium: 9.2 mg/dL (ref 8.9–10.3)
Chloride: 109 mmol/L (ref 101–111)
Creatinine, Ser: 0.73 mg/dL (ref 0.44–1.00)
GFR calc Af Amer: >60 mL/min (ref >60)
GLUCOSE: 105 mg/dL — AB (ref 65–99)
Potassium: 3.6 mmol/L (ref 3.5–5.1)
SODIUM: 141 mmol/L (ref 135–145)

## 2015-05-30 LAB — CBC
HCT: 35.3 % — ABNORMAL LOW (ref 36.0–46.0)
Hemoglobin: 12 g/dL (ref 12.0–15.0)
MCH: 31 pg (ref 26.0–34.0)
MCHC: 34 g/dL (ref 30.0–36.0)
MCV: 91.2 fL (ref 78.0–100.0)
PLATELETS: 278 10*3/uL (ref 150–400)
RBC: 3.87 MIL/uL (ref 3.87–5.11)
RDW: 13.3 % (ref 11.5–15.5)
WBC: 7.1 10*3/uL (ref 4.0–10.5)

## 2015-05-30 NOTE — ED Notes (Signed)
Pt reports out of seizure med's for 3 days. Pts friend reports 2 seizures today one at 0300 and last one at 1200pm. Friend states they last about 2 minutes and pt had full body shaking.  Pt is alert and ox4. Pt c/o of headache. Pt also bit tongue on right side during seizure.

## 2015-05-30 NOTE — ED Notes (Signed)
Pt does not want to wait any longer. Pt sitting up in wheelchair.

## 2015-05-30 NOTE — ED Notes (Signed)
Pt asking to leave, states she did not think the wait would be this long. Discussed importance of getting pts seizure medications. Encouraged pt to wait

## 2015-06-06 ENCOUNTER — Emergency Department (INDEPENDENT_AMBULATORY_CARE_PROVIDER_SITE_OTHER)
Admission: EM | Admit: 2015-06-06 | Discharge: 2015-06-06 | Disposition: A | Discharge status: Home / Self Care | Payer: Medicaid Other | Source: Home / Self Care | Attending: Emergency Medicine | Admitting: Emergency Medicine

## 2015-06-06 ENCOUNTER — Encounter (HOSPITAL_COMMUNITY): Payer: Self-pay | Admitting: Emergency Medicine

## 2015-06-06 DIAGNOSIS — N939 Abnormal uterine and vaginal bleeding, unspecified: Secondary | ICD-10-CM | POA: Diagnosis not present

## 2015-06-06 LAB — POCT URINALYSIS DIP (DEVICE)
Bilirubin Urine: NEGATIVE
Glucose, UA: NEGATIVE mg/dL
Ketones, ur: NEGATIVE mg/dL
Leukocytes, UA: NEGATIVE
Nitrite: NEGATIVE
Protein, ur: NEGATIVE mg/dL
Specific Gravity, Urine: 1.02 (ref 1.005–1.030)
Urobilinogen, UA: 1 mg/dL (ref 0.0–1.0)
pH: 7.5 (ref 5.0–8.0)

## 2015-06-06 LAB — POCT PREGNANCY, URINE: Preg Test, Ur: NEGATIVE

## 2015-06-06 MED ORDER — IBUPROFEN 600 MG PO TABS: 600.0000 mg | ORAL_TABLET | Freq: Four times a day (QID) | ORAL | Status: DC | PRN

## 2015-06-06 MED ORDER — NAPROXEN 500 MG PO TABS: 500.0000 mg | ORAL_TABLET | Freq: Two times a day (BID) | ORAL | Status: DC

## 2015-06-06 NOTE — Discharge Instructions (Signed)
Abnormal Uterine Bleeding °Abnormal uterine bleeding means bleeding from the vagina that is not your normal menstrual period. This can be: °· Bleeding or spotting between periods. °· Bleeding after sex (sexual intercourse). °· Bleeding that is heavier or more than normal. °· Periods that last longer than usual. °· Bleeding after menopause. °There are many problems that may cause this. Treatment will depend on the cause of the bleeding. Any kind of bleeding that is not normal should be reviewed by your doctor.  °HOME CARE °Watch your condition for any changes. These actions may lessen any discomfort you are having: °· Do not use tampons or douches as told by your doctor. °· Change your pads often. °You should get regular pelvic exams and Pap tests. Keep all appointments for tests as told by your doctor. °GET HELP IF: °· You are bleeding for more than 1 week. °· You feel dizzy at times. °GET HELP RIGHT AWAY IF:  °· You pass out. °· You have to change pads every 15 to 30 minutes. °· You have belly pain. °· You have a fever. °· You become sweaty or weak. °· You are passing large blood clots from the vagina. °· You feel sick to your stomach (nauseous) and throw up (vomit). °MAKE SURE YOU: °· Understand these instructions. °· Will watch your condition. °· Will get help right away if you are not doing well or get worse. °  °This information is not intended to replace advice given to you by your health care provider. Make sure you discuss any questions you have with your health care provider. °  °Document Released: 03/03/2009 Document Revised: 05/11/2013 Document Reviewed: 12/03/2012 °Elsevier Interactive Patient Education ©2016 Elsevier Inc. ° °

## 2015-06-06 NOTE — ED Notes (Addendum)
Pt here with multiple complaints Left abdominal sharp, intermit pain with nausea,vomiting radiating to lower back Frequent headaches,dizziness and weakness Pt is currently on menstrual with heavy bleeding but states her periods are regularly on the 11th each month Slight yellow vaginal d/c with odor Hx Tubal ligation

## 2015-06-06 NOTE — ED Provider Notes (Signed)
CSN: [id number]     Arrival date & time 06/06/15  1530 History   First MD Initiated Contact with Patient 06/06/15 1625     Chief Complaint  Patient presents with  . Abdominal Pain  . Headache   (Consider location/radiation/quality/duration/timing/severity/associated sxs/prior Treatment) HPI History obtained from patient:   LOCATION: back and vagina  SEVERITY:3 DURATION: 1 week CONTEXT: sudden onset of pain, bleeding noted for 3 days not due for menses.  QUALITY:ache MODIFYING FACTORS:ibuprofen for pain ASSOCIATED SYMPTOMS: abdominal pain. Left lower quad TIMING:wax and wanes OCCUPATION: Past Medical History  Diagnosis Date  . JME (juvenile myoclonic epilepsy) (HCC)     Age of 6  . Generalized headaches   . Panic attack   . Anxiety   . Seizures [facility])    Past Surgical History  Procedure Laterality Date  . Tubal ligation     Family History  Problem Relation Age of Onset  . Healthy Mother   . Other Father   . Asthma Brother   . Healthy Son   . Healthy Daughter    Social History  Substance Use Topics  . Smoking status: Never Smoker   . Smokeless tobacco: Never Used  . Alcohol Use: Yes   OB History    Gravida Para Term Preterm AB TAB SAB Ectopic Multiple Living   Review of Systems ROS +'ve vaginal pain, and bleeding  Denies: HEADACHE, NAUSEA, ABDOMINAL PAIN, CHEST PAIN, CONGESTION, DYSURIA, SHORTNESS OF BREATH  Allergies  Lipitor; Food allergy formula; and Hydrocodone  Home Medications   Prior to Admission medications   Medication Sig Start Date End Date Taking? Authorizing Provider  Alum & Mag Hydroxide-Simeth (MAGIC MOUTHWASH W/LIDOCAINE) SOLN Take 5 mLs by mouth 3 (three) times daily. Patient not taking: Reported on 07/14/2014 03/16/14   Maxine Glenn, MD  aspirin-acetaminophen-caffeine [facility] MIGRAINE) [phone] MG per tablet Take 2 tablets by mouth every 6 (six) hours as needed for headache.    Historical Provider, MD  cephALEXin  (KEFLEX) 500 MG capsule Take 1 capsule (500 mg total) by mouth 4 (four) times daily. Patient not taking: Reported on 11/01/2014 08/30/14   Eber Hong, MD  clonazePAM (KLONOPIN) 0.5 MG tablet Take 1 tablet (0.5 mg total) by mouth 2 (two) times daily as needed for anxiety. 11/28/14   Roxy Horseman, PA-C  diazepam (DIASTAT ACUDIAL) 10 MG GEL Place 10 mg rectally once. Patient not taking: Reported on 11/01/2014 07/14/14   Shaune Pollack, MD  lamoTRIgine (LAMICTAL) 100 MG tablet Take 2 tablets (200 mg total) by mouth daily. 11/28/14   Roxy Horseman, PA-C  LORazepam (ATIVAN) 0.5 MG tablet Take 1 tablet (0.5 mg total) by mouth 3 (three) times daily as needed for anxiety. Patient not taking: Reported on 07/14/2014 12/17/13   Renne Crigler, PA-C  naproxen (NAPROSYN) 500 MG tablet Take 1 tablet (500 mg total) by mouth 2 (two) times daily with a meal. Patient not taking: Reported on 11/01/2014 08/30/14   Eber Hong, MD  oxyCODONE-acetaminophen (PERCOCET/ROXICET) 5-325 MG per tablet Take 1 tablet by mouth every 4 (four) hours as needed for severe pain. May take 2 tablets PO q 6 hours for severe pain - Do not take with Tylenol as this tablet already contains tylenol Patient not taking: Reported on 11/01/2014 08/30/14   Eber Hong, MD  zonisamide (ZONEGRAN) 100 MG capsule Take 8 capsules (800 mg total) by mouth daily. 11/28/14   Roxy Horseman, PA-C  Meds Ordered and Administered this Visit  Medications - No data to display  BP 108/66 mmHg  Pulse 68  Temp(Src) 98.7 F (37.1 C) (Oral)  Resp 16  SpO2 100%  LMP 06/04/2015 No data found.   Physical Exam  Constitutional: She is oriented to person, place, and time. She appears well-developed and well-nourished.  Pulmonary/Chest: Effort normal.  Abdominal: Soft. There is no tenderness. There is no rebound and no guarding.  Genitourinary: No vaginal discharge found.  Exam performed with female chaperone and pt permission.   Small amount of blood in  vaginal vault. Os is closed.   No adnexal tenderness  Musculoskeletal: Normal range of motion.  Neurological: She is alert and oriented to person, place, and time.  Skin: Skin is warm and dry.  Psychiatric: She has a normal mood and affect. Her behavior is normal. Thought content normal.  Nursing note and vitals reviewed.   ED Course  Procedures (including critical care time)  Labs Review Labs Reviewed - No data to display  Imaging Review No results found.   Visual Acuity Review  Right Eye Distance:   Left Eye Distance:   Bilateral Distance:    Right Eye Near:   Left Eye Near:    Bilateral Near:         MDM   1. Vaginal bleeding       Patient is advised to continue home symptomatic treatment. Prescription for naproxen  sent pharmacy patient has indicated. Patient is advised that if there are new or worsening symptoms or attend the emergency department, or contact primary care provider. Instructions of care provided discharged home in stable condition.  THIS NOTE WAS GENERATED USING A VOICE RECOGNITION SOFTWARE PROGRAM. ALL REASONABLE EFFORTS  WERE MADE TO PROOFREAD THIS DOCUMENT FOR ACCURACY.   Tharon Aquas, PA 06/06/15 1721

## 2015-07-31 ENCOUNTER — Encounter (HOSPITAL_COMMUNITY): Payer: Self-pay | Admitting: Emergency Medicine

## 2015-07-31 ENCOUNTER — Emergency Department (HOSPITAL_COMMUNITY)
Admission: EM | Admit: 2015-07-31 | Discharge: 2015-08-01 | Disposition: A | Discharge status: Home / Self Care | Payer: Medicaid Other | Attending: Emergency Medicine | Admitting: Emergency Medicine

## 2015-07-31 DIAGNOSIS — Z79899 Other long term (current) drug therapy: Secondary | ICD-10-CM | POA: Diagnosis not present

## 2015-07-31 DIAGNOSIS — R569 Unspecified convulsions: Secondary | ICD-10-CM | POA: Diagnosis present

## 2015-07-31 DIAGNOSIS — G40B09 Juvenile myoclonic epilepsy, not intractable, without status epilepticus: Secondary | ICD-10-CM | POA: Diagnosis not present

## 2015-07-31 DIAGNOSIS — F410 Panic disorder [episodic paroxysmal anxiety] without agoraphobia: Secondary | ICD-10-CM | POA: Insufficient documentation

## 2015-07-31 DIAGNOSIS — R51 Headache: Secondary | ICD-10-CM | POA: Diagnosis not present

## 2015-07-31 LAB — URINALYSIS, ROUTINE W REFLEX MICROSCOPIC
BILIRUBIN URINE: NEGATIVE
GLUCOSE, UA: NEGATIVE mg/dL
HGB URINE DIPSTICK: NEGATIVE
Ketones, ur: NEGATIVE mg/dL
Leukocytes, UA: NEGATIVE
Nitrite: NEGATIVE
Protein, ur: NEGATIVE mg/dL
SPECIFIC GRAVITY, URINE: 1.026 (ref 1.005–1.030)
pH: 6 (ref 5.0–8.0)

## 2015-07-31 LAB — BASIC METABOLIC PANEL
Anion gap: 13 (ref 5–15)
BUN: 9 mg/dL (ref 6–20)
CO2: 24 mmol/L (ref 22–32)
CREATININE: 0.92 mg/dL (ref 0.44–1.00)
Calcium: 9.4 mg/dL (ref 8.9–10.3)
Chloride: 108 mmol/L (ref 101–111)
GFR calc Af Amer: >60 mL/min (ref >60)
GLUCOSE: 56 mg/dL — AB (ref 65–99)
POTASSIUM: 3.8 mmol/L (ref 3.5–5.1)
SODIUM: 145 mmol/L (ref 135–145)

## 2015-07-31 LAB — CBC
HCT: 34.6 % — ABNORMAL LOW (ref 36.0–46.0)
Hemoglobin: 11.5 g/dL — ABNORMAL LOW (ref 12.0–15.0)
MCH: 30.3 pg (ref 26.0–34.0)
MCHC: 33.2 g/dL (ref 30.0–36.0)
MCV: 91.1 fL (ref 78.0–100.0)
PLATELETS: 314 10*3/uL (ref 150–400)
RBC: 3.8 MIL/uL — AB (ref 3.87–5.11)
RDW: 13.5 % (ref 11.5–15.5)
WBC: 4.4 10*3/uL (ref 4.0–10.5)

## 2015-07-31 LAB — I-STAT BETA HCG BLOOD, ED (MC, WL, AP ONLY): I-stat hCG, quantitative: <5 m[IU]/mL (ref <5)

## 2015-07-31 NOTE — ED Notes (Signed)
Pt. reports seizure episodes for the last several days , she ran out of her seizure medications for several weeks , denies injury / respirations unlabored . No fever or chills.

## 2015-08-01 MED ORDER — ZONISAMIDE 100 MG PO CAPS
800.0000 mg | ORAL_CAPSULE | Freq: Every day | ORAL | Status: DC
  Administered 2015-08-01: 800 mg via ORAL
  Filled 2015-08-01: qty 8

## 2015-08-01 MED ORDER — LORAZEPAM 2 MG/ML IJ SOLN
1.0000 mg | Freq: Once | INTRAMUSCULAR | Status: AC
  Administered 2015-08-01: 1 mg via INTRAVENOUS
  Filled 2015-08-01: qty 1

## 2015-08-01 MED ORDER — LAMOTRIGINE 200 MG PO TABS: 200.0000 mg | ORAL_TABLET | Freq: Every day | ORAL | Status: DC

## 2015-08-01 MED ORDER — KETOROLAC TROMETHAMINE 30 MG/ML IJ SOLN
30.0000 mg | Freq: Once | INTRAMUSCULAR | Status: AC
  Administered 2015-08-01: 30 mg via INTRAVENOUS
  Filled 2015-08-01: qty 1

## 2015-08-01 MED ORDER — SODIUM CHLORIDE 0.9 % IV BOLUS (SEPSIS)
1000.0000 mL | Freq: Once | INTRAVENOUS | Status: AC
  Administered 2015-08-01: 1000 mL via INTRAVENOUS

## 2015-08-01 MED ORDER — ZONISAMIDE 100 MG PO CAPS
800.0000 mg | ORAL_CAPSULE | Freq: Every day | ORAL | Status: DC
Start: 2015-08-01 — End: 2015-10-27

## 2015-08-01 MED ORDER — LAMOTRIGINE 100 MG PO TABS
200.0000 mg | ORAL_TABLET | Freq: Once | ORAL | Status: AC
  Administered 2015-08-01: 200 mg via ORAL
  Filled 2015-08-01: qty 2

## 2015-08-01 NOTE — ED Notes (Signed)
Pt ambulated down the hallway and back independently.

## 2015-08-01 NOTE — Discharge Instructions (Signed)
Do not drive or operate heavy machinery.  Follow-up with neurology given.  Epilepsy Epilepsy is a disorder in which a person has repeated seizures over time. A seizure is a release of abnormal electrical activity in the brain. Seizures can cause a change in attention, behavior, or the ability to remain awake and alert (altered mental status). Seizures often involve uncontrollable shaking (convulsions).  Most people with epilepsy lead normal lives. However, people with epilepsy are at an increased risk of falls, accidents, and injuries. Therefore, it is important to begin treatment right away. CAUSES  Epilepsy has many possible causes. Anything that disturbs the normal pattern of brain cell activity can lead to seizures. This may include:   Head injury.  Birth trauma.  High fever as a child.  Stroke.  Bleeding into or around the brain.  Certain drugs.  Prolonged low oxygen, such as what occurs after CPR efforts.  Abnormal brain development.  Certain illnesses, such as meningitis, encephalitis (brain infection), malaria, and other infections.  An imbalance of nerve signaling chemicals (neurotransmitters).  SIGNS AND SYMPTOMS  The symptoms of a seizure can vary greatly from one person to another. Right before a seizure, you may have a warning (aura) that a seizure is about to occur. An aura may include the following symptoms:  Fear or anxiety.  Nausea.  Feeling like the room is spinning (vertigo).  Vision changes, such as seeing flashing lights or spots. Common symptoms during a seizure include:  Abnormal sensations, such as an abnormal smell or a bitter taste in the mouth.   Sudden, general body stiffness.   Convulsions that involve rhythmic jerking of the face, arm, or leg on one or both sides.   Sudden change in consciousness.   Appearing to be awake but not responding.   Appearing to be asleep but cannot be awakened.   Grimacing, chewing, lip smacking,  drooling, tongue biting, or loss of bowel or bladder control. After a seizure, you may feel sleepy for a while. DIAGNOSIS  Your health care provider will ask about your symptoms and take a medical history. Descriptions from any witnesses to your seizures will be very helpful in the diagnosis. A physical exam, including a detailed neurological exam, is necessary. Various tests may be done, such as:   An electroencephalogram (EEG). This is a painless test of your brain waves. In this test, a diagram is created of your brain waves. These diagrams can be interpreted by a specialist.  An MRI of the brain.   A CT scan of the brain.   A spinal tap (lumbar puncture, LP).  Blood tests to check for signs of infection or abnormal blood chemistry. TREATMENT  There is no cure for epilepsy, but it is generally treatable. Once epilepsy is diagnosed, it is important to begin treatment as soon as possible. For most people with epilepsy, seizures can be controlled with medicines. The following may also be used:  A pacemaker for the brain (vagus nerve stimulator) can be used for people with seizures that are not well controlled by medicine.  Surgery on the brain. For some people, epilepsy eventually goes away. HOME CARE INSTRUCTIONS   Follow your health care provider's recommendations on driving and safety in normal activities.  Get enough rest. Lack of sleep can cause seizures.  Only take over-the-counter or prescription medicines as directed by your health care provider. Take any prescribed medicine exactly as directed.  Avoid any known triggers of your seizures.  Keep a seizure diary. Record  what you recall about any seizure, especially any possible trigger.   Make sure the people you live and work with know that you are prone to seizures. They should receive instructions on how to help you. In general, a witness to a seizure should:   Cushion your head and body.   Turn you on your side.    Avoid unnecessarily restraining you.   Not place anything inside your mouth.   Call for emergency medical help if there is any question about what has occurred.   Follow up with your health care provider as directed. You may need regular blood tests to monitor the levels of your medicine.  SEEK MEDICAL CARE IF:   You develop signs of infection or other illness. This might increase the risk of a seizure.   You seem to be having more frequent seizures.   Your seizure pattern is changing.  SEEK IMMEDIATE MEDICAL CARE IF:   You have a seizure that does not stop after a few moments.   You have a seizure that causes any difficulty in breathing.   You have a seizure that results in a very severe headache.   You have a seizure that leaves you with the inability to speak or use a part of your body.    This information is not intended to replace advice given to you by your health care provider. Make sure you discuss any questions you have with your health care provider.   Document Released: 05/06/2005 Document Revised: 02/24/2013 Document Reviewed: 12/16/2012 Elsevier Interactive Patient Education [employer]2016 Elsevier Inc.

## 2015-08-01 NOTE — ED Notes (Signed)
Pt verbalized understanding to take seizure medications on a regular regimen and not to go periods of time without taking them. Pt stable, alert and oriented x 4 and d/c home with boyfriend driving. Pt to follow up with neurologist and pcp.

## 2015-08-01 NOTE — ED Provider Notes (Signed)
CSN: [id number]     Arrival date & time 07/31/15  1839 History  By signing my name below, I, Barbara Hodges, attest that this documentation has been prepared under the direction and in the presence of Shon Baton, MD. Electronically Signed: Bethel Hodges, ED Scribe. 08/01/2015. 9:13 AM   No chief complaint on file.   The history is provided by the patient. No language interpreter was used.   Barbara Hodges is a 30 y.o. female with history of juvenile myoclonic epilepsy who presents to the Emergency Department complaining of seizures with onset 3 days ago. Pt states that she has had 4 seizures over the last 3 days. She has been out of Lamictal, klonopin, and Ativan for 2 months because she no longer has a neurologist. The seizures are tonic clonic and last for 3-4 minutes. She last had a seizure yesterday morning.  Associated symptoms include a 10/10 in severity headache for 4 days and abdominal pain. The headache is typical of headaches that she has after seizures. Pt took nothing for pain PTA.   Past Medical History  Diagnosis Date  . JME (juvenile myoclonic epilepsy) (HCC)     Age of 38  . Generalized headaches   . Panic attack   . Anxiety   . Seizures [facility])    Past Surgical History  Procedure Laterality Date  . Tubal ligation     Family History  Problem Relation Age of Onset  . Healthy Mother   . Other Father   . Asthma Brother   . Healthy Son   . Healthy Daughter    Social History  Substance Use Topics  . Smoking status: Never Smoker   . Smokeless tobacco: Never Used  . Alcohol Use: Yes   OB History    Gravida Para Term Preterm AB TAB SAB Ectopic Multiple Living   Review of Systems  Constitutional: Negative for fever.  Respiratory: Negative for shortness of breath.   Cardiovascular: Negative for chest pain.  Gastrointestinal: Negative for nausea and vomiting.  Neurological: Positive for seizures and headaches.  All other systems  reviewed and are negative.     Allergies  Lipitor; Naprosyn; Food allergy formula; and Hydrocodone  Home Medications   Prior to Admission medications   Medication Sig Start Date End Date Taking? Authorizing Provider  Alum & Mag Hydroxide-Simeth (MAGIC MOUTHWASH W/LIDOCAINE) SOLN Take 5 mLs by mouth 3 (three) times daily. Patient not taking: Reported on 07/14/2014 03/16/14   Maxine Glenn, MD  cephALEXin (KEFLEX) 500 MG capsule Take 1 capsule (500 mg total) by mouth 4 (four) times daily. Patient not taking: Reported on 11/01/2014 08/30/14   Eber Hong, MD  clonazePAM (KLONOPIN) 0.5 MG tablet Take 1 tablet (0.5 mg total) by mouth 2 (two) times daily as needed for anxiety. Patient not taking: Reported on 08/01/2015 11/28/14   Roxy Horseman, PA-C  diazepam (DIASTAT ACUDIAL) 10 MG GEL Place 10 mg rectally once. Patient not taking: Reported on 11/01/2014 07/14/14   Shaune Pollack, MD  ibuprofen (ADVIL,MOTRIN) 600 MG tablet Take 1 tablet (600 mg total) by mouth every 6 (six) hours as needed. Patient not taking: Reported on 08/01/2015 06/06/15   Tharon Aquas, PA  lamoTRIgine (LAMICTAL) 200 MG tablet Take 1 tablet (200 mg total) by mouth daily. 08/01/15   Shon Baton, MD  LORazepam (ATIVAN) 0.5 MG tablet Take 1 tablet (0.5 mg total) by mouth 3 (three)  times daily as needed for anxiety. Patient not taking: Reported on 07/14/2014 12/17/13   Renne Crigler, PA-C  oxyCODONE-acetaminophen (PERCOCET/ROXICET) 5-325 MG per tablet Take 1 tablet by mouth every 4 (four) hours as needed for severe pain. May take 2 tablets PO q 6 hours for severe pain - Do not take with Tylenol as this tablet already contains tylenol Patient not taking: Reported on 11/01/2014 08/30/14   Eber Hong, MD  zonisamide (ZONEGRAN) 100 MG capsule Take 8 capsules (800 mg total) by mouth daily. 08/01/15   Shon Baton, MD   BP 106/68 mmHg  Pulse 61  Temp(Src) 98.4 F (36.9 C) (Oral)  Resp 16  Ht  (1.676 m)  Wt 144 lb  (65.318 kg)  BMI 23.25 kg/m2  SpO2 100% Physical Exam  Constitutional: She is oriented to person, place, and time. She appears well-developed and well-nourished. No distress.  HENT:  Head: Normocephalic and atraumatic.  Eyes: EOM are normal. Pupils are equal, round, and reactive to light.  Cardiovascular: Normal rate and regular rhythm.   No murmur heard. Pulmonary/Chest: Effort normal and breath sounds normal. No respiratory distress. She has no wheezes.  Abdominal: Soft. There is no tenderness.  Neurological: She is alert and oriented to person, place, and time.  Cranial nerves II through XII intact, 5 out of 5 strength in all 4 extremities, no dysmetria to finger-nose-finger  Skin: Skin is warm and dry.  Psychiatric: She has a normal mood and affect.  Nursing note and vitals reviewed.   ED Course  Procedures (including critical care time) DIAGNOSTIC STUDIES: Oxygen Saturation is 100% on RA,  normal by my interpretation.    COORDINATION OF CARE: 12:53 AM Discussed treatment plan which includes lab work with pt at bedside and pt agreed to plan.  Labs Review Labs Reviewed  BASIC METABOLIC PANEL - Abnormal; Notable for the following:    Glucose, Bld 56 (*)    All other components within normal limits  CBC - Abnormal; Notable for the following:    RBC 3.80 (*)    Hemoglobin 11.5 (*)    HCT 34.6 (*)    All other components within normal limits  URINALYSIS, ROUTINE W REFLEX MICROSCOPIC (NOT AT [facility])  I-STAT BETA HCG BLOOD, ED ([facility], [facility], [facility] ONLY)    Imaging Review No results found. I have personally reviewed and evaluated these images and lab results as part of my medical decision-making.   EKG Interpretation None      MDM   Final diagnoses:  Seizure [facility])    She presents with frequent seizure activity. History of seizure disorder. Out of her medication for the last 2 months. Initially nontoxic and nonfocal. Basic labwork obtained. Noted for borderline low sugar but  otherwise reassuring. Patient is not lethargic. She did have a seizure-like episode in the emergency room. Upon my evaluation, she appeared postictal and was drowsy. Patient was given IV Ativan. She rested comfortably in the ER. Patient was given her oral Lamictal and Zonegran.  She was observed without further seizure activity. She was discharged home and restarted on her seizure medications. She was prescribed Klonopin for anxiety. Given that she's been off of this for 2 months and is out of the window for withdrawal, will have her follow-up with her primary physician for this prescription. She was given neurology referral.  She was able to eat and ambulate independently without difficulty.  After history, exam, and medical workup I feel the patient has been appropriately medically screened  and is safe for discharge home. Pertinent diagnoses were discussed with the patient. Patient was given return precautions.  I personally performed the services described in this documentation, which was scribed in my presence. The recorded information has been reviewed and is accurate.   Shon Batonourtney F Hendryx Ricke, MD 08/01/15 [phone]

## 2015-08-01 NOTE — ED Notes (Signed)
Pt's boyfriend stated pt had short seizure, pt found to be postictal by this RN, Dr Wilkie AyeHorton ordered ativan for pt. Pt in side lying position with head protection. Pt in decorticate posturing and not responding verbally. Pt agitated.

## 2015-08-09 ENCOUNTER — Encounter (HOSPITAL_COMMUNITY): Payer: Self-pay | Admitting: Emergency Medicine

## 2015-08-09 ENCOUNTER — Emergency Department (HOSPITAL_COMMUNITY)
Admission: EM | Admit: 2015-08-09 | Discharge: 2015-08-09 | Disposition: A | Discharge status: Home / Self Care | Payer: Medicaid Other | Attending: Emergency Medicine | Admitting: Emergency Medicine

## 2015-08-09 DIAGNOSIS — R110 Nausea: Secondary | ICD-10-CM | POA: Insufficient documentation

## 2015-08-09 DIAGNOSIS — R51 Headache: Secondary | ICD-10-CM | POA: Diagnosis present

## 2015-08-09 DIAGNOSIS — Z79899 Other long term (current) drug therapy: Secondary | ICD-10-CM | POA: Insufficient documentation

## 2015-08-09 DIAGNOSIS — J029 Acute pharyngitis, unspecified: Secondary | ICD-10-CM | POA: Insufficient documentation

## 2015-08-09 DIAGNOSIS — F410 Panic disorder [episodic paroxysmal anxiety] without agoraphobia: Secondary | ICD-10-CM | POA: Insufficient documentation

## 2015-08-09 DIAGNOSIS — R42 Dizziness and giddiness: Secondary | ICD-10-CM | POA: Insufficient documentation

## 2015-08-09 DIAGNOSIS — B9789 Other viral agents as the cause of diseases classified elsewhere: Secondary | ICD-10-CM

## 2015-08-09 DIAGNOSIS — J028 Acute pharyngitis due to other specified organisms: Secondary | ICD-10-CM

## 2015-08-09 MED ORDER — ACETAMINOPHEN 500 MG PO TABS
1000.0000 mg | ORAL_TABLET | Freq: Once | ORAL | Status: AC
  Administered 2015-08-09: 1000 mg via ORAL
  Filled 2015-08-09: qty 2

## 2015-08-09 MED ORDER — IBUPROFEN 800 MG PO TABS
800.0000 mg | ORAL_TABLET | Freq: Once | ORAL | Status: AC
  Administered 2015-08-09: 800 mg via ORAL
  Filled 2015-08-09: qty 1

## 2015-08-09 MED ORDER — OXYCODONE HCL 5 MG PO TABS
5.0000 mg | ORAL_TABLET | Freq: Once | ORAL | Status: AC
  Administered 2015-08-09: 5 mg via ORAL
  Filled 2015-08-09: qty 1

## 2015-08-09 NOTE — ED Notes (Signed)
Patient now reports severe headache, dizziness upon standing. Nausea, no vomiting. Generalized body aches. Hx of seizure. Also reports toothache.

## 2015-08-09 NOTE — ED Notes (Signed)
Patient here with generalized body aches. States "I already told the registration lady what was wrong and im not repeating it". Limited information on symptoms today.

## 2015-08-09 NOTE — ED Provider Notes (Signed)
CSN: [id number]     Arrival date & time 08/09/15  1711 History   First MD Initiated Contact with Patient 08/09/15 2109     Chief Complaint  Patient presents with  . Headache  . Dizziness  . Nausea     (Consider location/radiation/quality/duration/timing/severity/associated sxs/prior Treatment) Patient is a 30 y.o. female presenting with general illness. The history is provided by the patient.  Illness Severity:  Mild Onset quality:  Gradual Duration:  2 days Timing:  Constant Progression:  Worsening Chronicity:  New Associated symptoms: headaches, myalgias, rhinorrhea and sore throat   Associated symptoms: no chest pain, no congestion, no fever, no nausea, no shortness of breath, no vomiting and no wheezing    30 yo F With a chief complaint of a sore throat fevers chills and body aches. This been going on for the past couple days. Denies cough congestion abdominal pain dysuria. Patient has tried some unknown over-the-counter medicine without improvement. Denies sick contacts. Complaining of a right sided headache that is worse at the TMJ. Also has a fractured tooth that is causing her ongoing issues.  Past Medical History  Diagnosis Date  . JME (juvenile myoclonic epilepsy) (HCC)     Age of 30  . Generalized headaches   . Panic attack   . Anxiety   . Seizures [facility](HCC)    Past Surgical History  Procedure Laterality Date  . Tubal ligation     Family History  Problem Relation Age of Onset  . Healthy Mother   . Other Father   . Asthma Brother   . Healthy Son   . Healthy Daughter    Social History  Substance Use Topics  . Smoking status: Never Smoker   . Smokeless tobacco: Never Used  . Alcohol Use: Yes   OB History    Gravida Para Term Preterm AB TAB SAB Ectopic Multiple Living   2 1 1  1           Review of Systems  Constitutional: Negative for fever and chills.  HENT: Positive for rhinorrhea and sore throat. Negative for congestion.   Eyes: Negative for redness  and visual disturbance.  Respiratory: Negative for shortness of breath and wheezing.   Cardiovascular: Negative for chest pain and palpitations.  Gastrointestinal: Negative for nausea and vomiting.  Genitourinary: Negative for dysuria and urgency.  Musculoskeletal: Positive for myalgias. Negative for arthralgias.  Skin: Negative for pallor and wound.  Neurological: Positive for headaches. Negative for dizziness.      Allergies  Lipitor; Naprosyn; Food allergy formula; and Hydrocodone  Home Medications   Prior to Admission medications   Medication Sig Start Date End Date Taking? Authorizing Provider  lamoTRIgine (LAMICTAL) 200 MG tablet Take 1 tablet (200 mg total) by mouth daily. 08/01/15  Yes Shon Batonourtney F Horton, MD  zonisamide (ZONEGRAN) 100 MG capsule Take 8 capsules (800 mg total) by mouth daily. 08/01/15  Yes Shon Batonourtney F Horton, MD  Alum & Mag Hydroxide-Simeth (MAGIC MOUTHWASH W/LIDOCAINE) SOLN Take 5 mLs by mouth 3 (three) times daily. Patient not taking: Reported on 07/14/2014 03/16/14   Maxine GlennAnn Batista, MD  cephALEXin (KEFLEX) 500 MG capsule Take 1 capsule (500 mg total) by mouth 4 (four) times daily. Patient not taking: Reported on 11/01/2014 08/30/14   Eber HongBrian Miller, MD  clonazePAM (KLONOPIN) 0.5 MG tablet Take 1 tablet (0.5 mg total) by mouth 2 (two) times daily as needed for anxiety. Patient not taking: Reported on 08/01/2015 11/28/14   Roxy Horsemanobert Browning, PA-C  oxyCODONE-acetaminophen (PERCOCET/ROXICET) [phone]  MG per tablet Take 1 tablet by mouth every 4 (four) hours as needed for severe pain. May take 2 tablets PO q 6 hours for severe pain - Do not take with Tylenol as this tablet already contains tylenol Patient not taking: Reported on 11/01/2014 08/30/14   Eber Hong, MD   BP 91/62 mmHg  Pulse 58  Temp(Src) 98.2 F (36.8 C) (Oral)  Resp 16  SpO2 100% Physical Exam  Constitutional: She is oriented to person, place, and time. She appears well-developed and well-nourished. No distress.   HENT:  Head: Normocephalic and atraumatic.  Swollen turbinates, posterior nasal drip, no noted sinus ttp, tm normal bilaterally.   Vesicles noted to the posterior oropharynx R 2nd molar fracture.  Mild TTP.  No noted erythema or fluctuance nearby.   Eyes: EOM are normal. Pupils are equal, round, and reactive to light.  Neck: Normal range of motion. Neck supple.  Cardiovascular: Normal rate and regular rhythm.  Exam reveals no gallop and no friction rub.   No murmur heard. Pulmonary/Chest: Effort normal. She has no wheezes. She has no rales.  Abdominal: Soft. She exhibits no distension. There is no tenderness.  Musculoskeletal: She exhibits no edema or tenderness.  Neurological: She is alert and oriented to person, place, and time. She has normal strength. No cranial nerve deficit or sensory deficit. She displays a negative Romberg sign. Coordination and gait normal. GCS eye subscore is 4. GCS verbal subscore is 5. GCS motor subscore is 6. She displays no Babinski's sign on the right side. She displays no Babinski's sign on the left side.  Reflex Scores:      Tricep reflexes are 2+ on the right side and 2+ on the left side.      Bicep reflexes are 2+ on the right side and 2+ on the left side.      Brachioradialis reflexes are 2+ on the right side and 2+ on the left side.      Patellar reflexes are 2+ on the right side and 2+ on the left side.      Achilles reflexes are 2+ on the right side and 2+ on the left side. Skin: Skin is warm and dry. She is not diaphoretic.  Psychiatric: She has a normal mood and affect. Her behavior is normal.  Nursing note and vitals reviewed.   ED Course  Procedures (including critical care time) Labs Review Labs Reviewed - No data to display  Imaging Review No results found. I have personally reviewed and evaluated these images and lab results as part of my medical decision-making.   EKG Interpretation None      MDM   Final diagnoses:  Acute  viral pharyngitis    30 yo F with a chief complaint of headache. This is right sided worse with moving her jaw. Also having a sore throat. Patient has the sequelae lesions to the back or throat consistent with a viral pharyngitis. No tonsillar swelling uvular deviation drooling. Well appearing non toxic.  Treat symptomatically.   9:39 PM:  I have discussed the diagnosis/risks/treatment options with the patient and family and believe the pt to be eligible for discharge home to follow-up with PCP. We also discussed returning to the ED immediately if new or worsening sx occur. We discussed the sx which are most concerning (e.g., sudden worsening pain, fever, inability to tolerate by mouth) that necessitate immediate return. Medications administered to the patient during their visit and any new prescriptions provided to the patient are  listed below.  Medications given during this visit Medications  acetaminophen (TYLENOL) tablet 1,000 mg (not administered)  ibuprofen (ADVIL,MOTRIN) tablet 800 mg (not administered)  oxyCODONE (Oxy IR/ROXICODONE) immediate release tablet 5 mg (not administered)    New Prescriptions   No medications on file    The patient appears reasonably screen and/or stabilized for discharge and I doubt any other medical condition or other [facility] requiring further screening, evaluation, or treatment in the ED at this time prior to discharge.       Melene Plan, DO 08/09/15 2139

## 2015-08-09 NOTE — Discharge Instructions (Signed)
Take tylenol 2 pills 4 times a day and motrin 4 pills 3 times a day.  Drink plenty of fluids.  Return for worsening shortness of breath, headache, confusion. Follow up with your family doctor.  ° °Sore Throat °A sore throat is a painful, burning, sore, or scratchy feeling of the throat. There may be pain or tenderness when swallowing or talking. You may have other symptoms with a sore throat. These include coughing, sneezing, fever, or a swollen neck. A sore throat is often the first sign of another sickness. These sicknesses may include a cold, flu, strep throat, or an infection called mono. Most sore throats go away without medical treatment.  °HOME CARE  °· Only take medicine as told by your doctor. °· Drink enough fluids to keep your pee (urine) clear or pale yellow. °· Rest as needed. °· Try using throat sprays, lozenges, or suck on hard candy (if older than 4 years or as told). °· Sip warm liquids, such as broth, herbal tea, or warm water with honey. Try sucking on frozen ice pops or drinking cold liquids. °· Rinse the mouth (gargle) with salt water. Mix 1 teaspoon salt with 8 ounces of water. °· Do not smoke. Avoid being around others when they are smoking. °· Put a humidifier in your bedroom at night to moisten the air. You can also turn on a hot shower and sit in the bathroom for 5-10 minutes. Be sure the bathroom door is closed. °GET HELP RIGHT AWAY IF:  °· You have trouble breathing. °· You cannot swallow fluids, soft foods, or your spit (saliva). °· You have more puffiness (swelling) in the throat. °· Your sore throat does not get better in 7 days. °· You feel sick to your stomach (nauseous) and throw up (vomit). °· You have a fever or lasting symptoms for more than 2-3 days. °· You have a fever and your symptoms suddenly get worse. °MAKE SURE YOU:  °· Understand these instructions. °· Will watch your condition. °· Will get help right away if you are not doing well or get worse. °  °This information is  not intended to replace advice given to you by your health care provider. Make sure you discuss any questions you have with your health care provider. °  °Document Released: 02/13/2008 Document Revised: 01/29/2012 Document Reviewed: 01/12/2012 °Elsevier Interactive Patient Education ©2016 [employer] ° °

## 2015-10-27 ENCOUNTER — Emergency Department (HOSPITAL_COMMUNITY)
Admission: EM | Admit: 2015-10-27 | Discharge: 2015-10-27 | Disposition: A | Discharge status: Home / Self Care | Payer: Medicaid Other | Attending: Emergency Medicine | Admitting: Emergency Medicine

## 2015-10-27 ENCOUNTER — Encounter (HOSPITAL_COMMUNITY): Payer: Self-pay | Admitting: Emergency Medicine

## 2015-10-27 DIAGNOSIS — Z79899 Other long term (current) drug therapy: Secondary | ICD-10-CM | POA: Insufficient documentation

## 2015-10-27 DIAGNOSIS — G40909 Epilepsy, unspecified, not intractable, without status epilepticus: Secondary | ICD-10-CM | POA: Diagnosis not present

## 2015-10-27 DIAGNOSIS — R569 Unspecified convulsions: Secondary | ICD-10-CM

## 2015-10-27 LAB — BASIC METABOLIC PANEL
Anion gap: 8 (ref 5–15)
BUN: 10 mg/dL (ref 6–20)
CO2: 23 mmol/L (ref 22–32)
Calcium: 9.4 mg/dL (ref 8.9–10.3)
Chloride: 107 mmol/L (ref 101–111)
Creatinine, Ser: 0.79 mg/dL (ref 0.44–1.00)
GFR calc Af Amer: >60 mL/min (ref >60)
GFR calc non Af Amer: >60 mL/min (ref >60)
Glucose, Bld: 89 mg/dL (ref 65–99)
Potassium: 3.8 mmol/L (ref 3.5–5.1)
Sodium: 138 mmol/L (ref 135–145)

## 2015-10-27 LAB — CBC WITH DIFFERENTIAL/PLATELET
Basophils Absolute: 0 10*3/uL (ref 0.0–0.1)
Basophils Relative: 1 %
Eosinophils Absolute: 0.2 10*3/uL (ref 0.0–0.7)
Eosinophils Relative: 4 %
HCT: 37.3 % (ref 36.0–46.0)
Hemoglobin: 12.3 g/dL (ref 12.0–15.0)
Lymphocytes Relative: 36 %
Lymphs Abs: 1.6 10*3/uL (ref 0.7–4.0)
MCH: 29.8 pg (ref 26.0–34.0)
MCHC: 33 g/dL (ref 30.0–36.0)
MCV: 90.3 fL (ref 78.0–100.0)
Monocytes Absolute: 0.4 10*3/uL (ref 0.1–1.0)
Monocytes Relative: 9 %
Neutro Abs: 2.3 10*3/uL (ref 1.7–7.7)
Neutrophils Relative %: 50 %
Platelets: 303 10*3/uL (ref 150–400)
RBC: 4.13 MIL/uL (ref 3.87–5.11)
RDW: 12.7 % (ref 11.5–15.5)
WBC: 4.5 10*3/uL (ref 4.0–10.5)

## 2015-10-27 LAB — I-STAT BETA HCG BLOOD, ED (MC, WL, AP ONLY): I-stat hCG, quantitative: <5 m[IU]/mL (ref <5)

## 2015-10-27 MED ORDER — LAMOTRIGINE 100 MG PO TABS
200.0000 mg | ORAL_TABLET | Freq: Once | ORAL | Status: AC
  Administered 2015-10-27: 200 mg via ORAL
  Filled 2015-10-27: qty 2

## 2015-10-27 MED ORDER — ZONISAMIDE 100 MG PO CAPS
100.0000 mg | ORAL_CAPSULE | Freq: Every day | ORAL | Status: DC
  Administered 2015-10-27: 100 mg via ORAL
  Filled 2015-10-27: qty 1

## 2015-10-27 MED ORDER — LAMOTRIGINE 200 MG PO TABS: 200.0000 mg | ORAL_TABLET | Freq: Every day | ORAL | Status: DC

## 2015-10-27 MED ORDER — ZONISAMIDE 100 MG PO CAPS
700.0000 mg | ORAL_CAPSULE | Freq: Once | ORAL | Status: DC
  Filled 2015-10-27: qty 7

## 2015-10-27 MED ORDER — ZONISAMIDE 100 MG PO CAPS: 800.0000 mg | ORAL_CAPSULE | Freq: Every day | ORAL | Status: DC

## 2015-10-27 NOTE — ED Notes (Signed)
Patient left with boyfriend. Discharge papers in hand and medications and follow up reviewed. Patient vitals stable and ambulatory.

## 2015-10-27 NOTE — ED Provider Notes (Signed)
CSN: [id number]     Arrival date & time 10/27/15  1117 History   First MD Initiated Contact with Patient 10/27/15 1155     Chief Complaint  Patient presents with  . Seizures    HPI    30 year old female presents today with reports of seizure. Husband notes that patient has a history of epilepsy and has run out of her medications. Patient reports that she has not had her medications in 3 months. He reports that patient has had seizures over the last week, reports Tuesday. He describes this as full body shaking, with postictal phase thereafter. He denies any trauma, any changes in lifestyle, foods, drinks. Patient denies any drug or alcohol use. They note that seizures are usually aggravated by exposure to stress or heat. He notes that while she was taking medication she was not having seizures. They report they had been seen by [facility], but were discharged from the practice as they missed too many appointments. He notes that they have a follow-up appointment with her primary care provider, this is not for months, and has come to prescribe the medication.   Patient at the time of evaluation reports that she feels fatigued, she denies any headache, chest pain, shortness of breath, fever, chills, nausea, vomiting, abdominal pain. She denies any trauma from the seizures. No neurological deficits.   Past Medical History  Diagnosis Date  . JME (juvenile myoclonic epilepsy) (HCC)     Age of 24  . Generalized headaches   . Panic attack   . Anxiety   . Seizures [facility])    Past Surgical History  Procedure Laterality Date  . Tubal ligation     Family History  Problem Relation Age of Onset  . Healthy Mother   . Other Father   . Asthma Brother   . Healthy Son   . Healthy Daughter    Social History  Substance Use Topics  . Smoking status: Never Smoker   . Smokeless tobacco: Never Used  . Alcohol Use: Yes   OB History    Gravida Para Term Preterm AB TAB SAB Ectopic Multiple  Living   Review of Systems  All other systems reviewed and are negative.   Allergies  Lipitor; Naprosyn; Food allergy formula; and Hydrocodone  Home Medications   Prior to Admission medications   Medication Sig Start Date End Date Taking? Authorizing Provider  ibuprofen (ADVIL,MOTRIN) 200 MG tablet Take 400 mg by mouth every 6 (six) hours as needed for headache.   Yes Historical Provider, MD  clonazePAM (KLONOPIN) 0.5 MG tablet Take 1 tablet (0.5 mg total) by mouth 2 (two) times daily as needed for anxiety. Patient not taking: Reported on 08/01/2015 11/28/14   Roxy Horseman, PA-C  lamoTRIgine (LAMICTAL) 200 MG tablet Take 1 tablet (200 mg total) by mouth daily. 10/27/15   Eyvonne Mechanic, PA-C  zonisamide (ZONEGRAN) 100 MG capsule Take 8 capsules (800 mg total) by mouth daily. 10/27/15   Adal Sereno, PA-C   BP 101/55 mmHg  Pulse 59  Temp(Src) 98.1 F (36.7 C) (Oral)  Resp 16  SpO2 98%   Physical Exam  Constitutional: She is oriented to person, place, and time. She appears well-developed and well-nourished.  Well appearing in no acute distress  HENT:  Head: Normocephalic and atraumatic.  Right Ear: External ear normal.  Left Ear: External ear normal.  Eyes: Conjunctivae are normal. Pupils are equal,  round, and reactive to light. Right eye exhibits no discharge. Left eye exhibits no discharge. No scleral icterus.  Neck: Normal range of motion. No JVD present. No tracheal deviation present.  Pulmonary/Chest: Effort normal. No stridor. No respiratory distress. She has no wheezes. She has no rales. She exhibits no tenderness.  Abdominal: Soft. There is no tenderness.  Musculoskeletal: Normal range of motion. She exhibits no edema or tenderness.  Neurological: She is alert and oriented to person, place, and time. She has normal strength. No cranial nerve deficit or sensory deficit. She displays a negative Romberg sign. Coordination and gait normal. GCS eye subscore  is 4. GCS verbal subscore is 5. GCS motor subscore is 6.  Skin: Skin is warm and dry. No rash noted. No erythema. No pallor.  Psychiatric: She has a normal mood and affect. Her behavior is normal. Judgment and thought content normal.  Nursing note and vitals reviewed.   ED Course  Procedures (including critical care time) Labs Review Labs Reviewed  CBC WITH DIFFERENTIAL/PLATELET  BASIC METABOLIC PANEL  I-STAT BETA HCG BLOOD, ED ([facility], [facility], [facility] ONLY)    Imaging Review No results found. I have personally reviewed and evaluated these images and lab results as part of my medical decision-making.   EKG Interpretation None      MDM   Final diagnoses:  Seizure (HCC)    Labs: I-STAT beta-hCG, CBC, BMP  Imaging:   Consults:  Therapeutics: Lamictal, Zonegran  Discharge Meds:  Lamictal, Zonegran  Assessment/Plan: 30 year old female presents today with seizures. History of same, patient has not been taking her medication as she has been released from her neurology practice for missed appointments. Patient has been using the emergency room for medication. Social work consult in, patient is asymptomatic here. She has no signs of electrolyte abnormalities, infectious etiology, or any other concerning signs or symptoms here. Neurologically intact, patient will be given medications for seizure, discharged home with follow-up information. Strict return precautions given.        Eyvonne MechanicJeffrey Damien Cisar, PA-C 10/27/15 1554  Raeford RazorStephen Kohut, MD 11/10/15 2123

## 2015-10-27 NOTE — Progress Notes (Addendum)
ED CM consulted by Maralyn SagoSarah CM about pt needing assist with getting a medicaid pcp to see in less than 4 weeks to assist to get prescriptions from Pt with [facility] 5 ED visits Pt having difficulty with seeing a medicaid doctor previously assigned. Also assist with medications CM spoke with [facility] ED RN Steward DroneBrenda to review pt options Steward DroneBrenda assisted ED CM to speak with pt and boyfriend Romeo Pt gave permission for Cm to review in details information with [facility]o ED CM discussed that pt has been [place] a [place] access pt per ED registration and is assigned a pcp of bernard marshall  Cm discussed if pt is unable to be seen by this provider she will need to return to DSS, call DSS or go to DSS web site to request assist with finding another provider to be seen in a timely manner so she may be able to start getting prescriptions from this provider vs ED visits for prescription refills  Provided DSS contact number and address Discussed that staff at [facility] would not be able to f/u on these responsibilities for the pt Romeo informed CM he understood and would be able to assist pt with these responsibilities  Discussed that there is not a [facility] program to assist pt with her medicaid medication co pay of $3 or less Referred to community resources (churches, support system) Discussed asking local pharmacy for assistance if possible Alm BustardRomeo voiced understanding  Teach back method used Romeo able to repeat back steps for pt to get a new medicaid pcp via DSS to establish care to start being seen to get monthly prescriptions.  Left Cm office number for further questions Sarah updated   Entered in d/c instructions    Kathreen CosierMarshall, Bernard A Call  This is your assigned Medicaid [place] access doctor If you prefer to see another Medicaid doctor other than the one on your Medicaid card  PLEASE CALL DSS [phone] or [phone]  [address] [place] [address] [phone]  medicaid [address] coverage  [place]: [id number] [address] [place] [address] [email or web address].ncdhhs.gov/ Use this website to assist with understanding your coverage & to renew application As a Medicaid client you MUST contact DSS/SSI each time you change address, move to another [place] county or another state to keep your address updated   [place] Transportation to Dr appts if you are have full Medicaid: [phone], [phone]   Please call DSS to get assist with getting a new doctor that can see you withn in the next few weeks Be sure to ask for the case worker assigned to you at DSS  Ask for a DSS list of medicaid doctors  your new medicaid doctor will be able to assist you with monthly prescriptions and referrals to specialists as needed

## 2015-10-27 NOTE — ED Notes (Signed)
Case manager speaking with boyfriend (with pt's permission) RE finding new MD.

## 2015-10-27 NOTE — Discharge Instructions (Signed)

## 2015-10-27 NOTE — ED Notes (Signed)
Pt awoken from sleeping to give urine.  States she cannot.  Given happy meal and water.

## 2015-10-27 NOTE — ED Notes (Addendum)
States has been  Having sz since tuesday and today she had 2 states has run out of meds

## 2015-12-28 ENCOUNTER — Emergency Department (HOSPITAL_COMMUNITY): Payer: Medicaid Other

## 2015-12-28 ENCOUNTER — Observation Stay (HOSPITAL_COMMUNITY)
Admission: EM | Admit: 2015-12-28 | Discharge: 2015-12-29 | Disposition: A | Discharge status: Home / Self Care | Payer: Medicaid Other | Attending: Internal Medicine | Admitting: Internal Medicine

## 2015-12-28 ENCOUNTER — Encounter (HOSPITAL_COMMUNITY): Payer: Self-pay

## 2015-12-28 DIAGNOSIS — Z9114 Patient's other noncompliance with medication regimen: Secondary | ICD-10-CM | POA: Diagnosis not present

## 2015-12-28 DIAGNOSIS — X58XXXA Exposure to other specified factors, initial encounter: Secondary | ICD-10-CM | POA: Insufficient documentation

## 2015-12-28 DIAGNOSIS — S01512A Laceration without foreign body of oral cavity, initial encounter: Secondary | ICD-10-CM | POA: Diagnosis not present

## 2015-12-28 DIAGNOSIS — R569 Unspecified convulsions: Secondary | ICD-10-CM | POA: Diagnosis not present

## 2015-12-28 LAB — CBC WITH DIFFERENTIAL/PLATELET
BASOS ABS: 0 10*3/uL (ref 0.0–0.1)
Basophils Relative: 0 %
EOS PCT: 3 %
Eosinophils Absolute: 0.1 10*3/uL (ref 0.0–0.7)
HEMATOCRIT: 35.5 % — AB (ref 36.0–46.0)
Hemoglobin: 11.9 g/dL — ABNORMAL LOW (ref 12.0–15.0)
LYMPHS ABS: 1.7 10*3/uL (ref 0.7–4.0)
LYMPHS PCT: 38 %
MCH: 30.5 pg (ref 26.0–34.0)
MCHC: 33.5 g/dL (ref 30.0–36.0)
MCV: 91 fL (ref 78.0–100.0)
MONO ABS: 0.3 10*3/uL (ref 0.1–1.0)
Monocytes Relative: 7 %
NEUTROS ABS: 2.4 10*3/uL (ref 1.7–7.7)
Neutrophils Relative %: 52 %
Platelets: 321 10*3/uL (ref 150–400)
RBC: 3.9 MIL/uL (ref 3.87–5.11)
RDW: 13.8 % (ref 11.5–15.5)
WBC: 4.6 10*3/uL (ref 4.0–10.5)

## 2015-12-28 LAB — COMPREHENSIVE METABOLIC PANEL
ALT: 10 U/L — AB (ref 14–54)
AST: 13 U/L — AB (ref 15–41)
Albumin: 3.9 g/dL (ref 3.5–5.0)
Alkaline Phosphatase: 35 U/L — ABNORMAL LOW (ref 38–126)
Anion gap: 6 (ref 5–15)
BILIRUBIN TOTAL: 0.8 mg/dL (ref 0.3–1.2)
BUN: 12 mg/dL (ref 6–20)
CO2: 25 mmol/L (ref 22–32)
CREATININE: 0.8 mg/dL (ref 0.44–1.00)
Calcium: 8.9 mg/dL (ref 8.9–10.3)
Chloride: 108 mmol/L (ref 101–111)
Glucose, Bld: 88 mg/dL (ref 65–99)
Potassium: 3.6 mmol/L (ref 3.5–5.1)
Sodium: 139 mmol/L (ref 135–145)
TOTAL PROTEIN: 7 g/dL (ref 6.5–8.1)

## 2015-12-28 LAB — ETHANOL

## 2015-12-28 LAB — I-STAT BETA HCG BLOOD, ED (MC, WL, AP ONLY)

## 2015-12-28 MED ORDER — LORAZEPAM 2 MG/ML IJ SOLN: 2.0000 mg | INTRAMUSCULAR | Status: DC | PRN

## 2015-12-28 MED ORDER — ACETAMINOPHEN 650 MG RE SUPP: 650.0000 mg | Freq: Four times a day (QID) | RECTAL | Status: DC | PRN

## 2015-12-28 MED ORDER — SODIUM CHLORIDE 0.9 % IV SOLN
INTRAVENOUS | Status: DC
  Administered 2015-12-28 – 2015-12-29 (×2): via INTRAVENOUS

## 2015-12-28 MED ORDER — SODIUM CHLORIDE 0.9% FLUSH: 3.0000 mL | Freq: Two times a day (BID) | INTRAVENOUS | Status: DC

## 2015-12-28 MED ORDER — LIDOCAINE VISCOUS 2 % MT SOLN
15.0000 mL | Freq: Once | OROMUCOSAL | Status: AC
  Administered 2015-12-28: 15 mL via OROMUCOSAL
  Filled 2015-12-28: qty 15

## 2015-12-28 MED ORDER — LAMOTRIGINE 200 MG PO TABS
200.0000 mg | ORAL_TABLET | Freq: Every day | ORAL | Status: DC
  Administered 2015-12-28 – 2015-12-29 (×2): 200 mg via ORAL
  Filled 2015-12-28 (×3): qty 1

## 2015-12-28 MED ORDER — METHYLPREDNISOLONE SODIUM SUCC 125 MG IJ SOLR
60.0000 mg | Freq: Once | INTRAMUSCULAR | Status: AC
  Administered 2015-12-28: 60 mg via INTRAVENOUS
  Filled 2015-12-28: qty 2

## 2015-12-28 MED ORDER — CLONAZEPAM 0.5 MG PO TABS
0.5000 mg | ORAL_TABLET | Freq: Every day | ORAL | Status: DC
  Administered 2015-12-28 – 2015-12-29 (×2): 0.5 mg via ORAL
  Filled 2015-12-28 (×2): qty 1

## 2015-12-28 MED ORDER — ONDANSETRON HCL 4 MG PO TABS: 4.0000 mg | ORAL_TABLET | Freq: Four times a day (QID) | ORAL | Status: DC | PRN

## 2015-12-28 MED ORDER — ZONISAMIDE 100 MG PO CAPS
800.0000 mg | ORAL_CAPSULE | Freq: Every day | ORAL | Status: DC
  Administered 2015-12-28 – 2015-12-29 (×2): 800 mg via ORAL
  Filled 2015-12-28 (×3): qty 8

## 2015-12-28 MED ORDER — ACETAMINOPHEN 325 MG PO TABS
650.0000 mg | ORAL_TABLET | Freq: Four times a day (QID) | ORAL | Status: DC | PRN
  Administered 2015-12-28 – 2015-12-29 (×2): 650 mg via ORAL
  Filled 2015-12-28 (×2): qty 2

## 2015-12-28 MED ORDER — LIDOCAINE VISCOUS 2 % MT SOLN
15.0000 mL | Freq: Four times a day (QID) | OROMUCOSAL | Status: DC | PRN
  Filled 2015-12-28 (×2): qty 15

## 2015-12-28 MED ORDER — SODIUM CHLORIDE 0.9 % IV SOLN
1000.0000 mg | Freq: Once | INTRAVENOUS | Status: AC
  Administered 2015-12-28: 1000 mg via INTRAVENOUS
  Filled 2015-12-28: qty 10

## 2015-12-28 MED ORDER — ONDANSETRON HCL 4 MG/2ML IJ SOLN: 4.0000 mg | Freq: Four times a day (QID) | INTRAMUSCULAR | Status: DC | PRN

## 2015-12-28 NOTE — ED Triage Notes (Signed)
Per EMS- patient reports that she has been out of her seizure medication x 1 month. Patient's significant other reported that she has had several seizures this week and had 2 yesterday.  Patient remains lethargic, but is alert and oriented x 4. Patient bit the right cheek,but would not allow EMS to assess.

## 2015-12-28 NOTE — Progress Notes (Signed)
Patient admitted to 5 East room 1517.  Significant other at bedside.  Patient is alert and oriented x 4, but drowsy.  Patient ambulated with one assist to bedside commode.  Telemetry box 66 placed on patient and CCMD notified.  Patient assessed and vitals obtained.  Oriented patient to unit, room, staff, and call bell.  No acute distress, no questions or concerns at this time.

## 2015-12-28 NOTE — H&P (Signed)
TRH H&P   Patient Demographics:    Barbara Hodges, is a 30 y.o. female  MRN: [id number]   DOB - [birth year]  Admit Date - 12/28/2015  Outpatient Primary MD for the patient is Kathreen Cosier, MD    Patient coming from: Home  Chief Complaint  Patient presents with  . Seizures      HPI:    Barbara Hodges  is a 30 y.o. 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 Patient was brought in for fiance after she had violent seizures at home with right-sided tongue bite, in the ER patient had a few more witnessed seizures and received IV Keppra after neuro was consulted over the phone. She was subsequently postictal and I was called to admit the patient.  The ER blood work and CT head unremarkable. Patient is now symptom free except for tongue pain after 10 bite.    Review of systems:    In addition to the HPI above,   No Fever-chills, No Headache, No changes with Vision or hearing, No problems swallowing food or Liquids, No Chest pain, Cough or Shortness of Breath, No Abdominal pain, No Nausea or Vommitting, Bowel movements are regular, No Blood in stool or Urine, No dysuria, No new skin rashes or bruises, No new joints pains-aches, Tongue pain after tongue bite, No new weakness, tingling, numbness in any extremity, No recent weight gain or loss, No polyuria, polydypsia or polyphagia, No significant Mental Stressors.  A full 10 point Review of Systems was done, except as stated above, all other Review of Systems were negative.   With Past History of the following :    Past Medical History:  Diagnosis Date  .  Anxiety   . Generalized headaches   . 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 Panic attack   . Seizures (HCC)       Past Surgical History:  Procedure Laterality Date  . TUBAL LIGATION        Social History:     Social History  Substance Use Topics  . Smoking status: Never Smoker  . Smokeless tobacco: Never Used  . Alcohol use Yes     Comment: occasionally  Family History :     Family History  Problem Relation Age of Onset  . Healthy Mother   . Other Father   . Asthma Brother   . Healthy Son   . Healthy Daughter        Home Medications:   Prior to Admission medications   Medication Sig Start Date End Date Taking? Authorizing Provider  clonazePAM (KLONOPIN) 0.5 MG tablet Take 1 tablet (0.5 mg total) by mouth 2 (two) times daily as needed for anxiety. 11/28/14  Yes Roxy Horsemanobert Browning, PA-C  lamoTRIgine (LAMICTAL) 200 MG tablet Take 1 tablet (200 mg total) by mouth daily. 10/27/15  Yes Jeffrey Hedges, PA-C  zonisamide (ZONEGRAN) 100 MG capsule Take 8 capsules (800 mg total) by mouth daily. 10/27/15  Yes Eyvonne MechanicJeffrey Hedges, PA-C     Allergies:     Allergies  Allergen Reactions  . Chocolate Anaphylaxis  . Lipitor [Atorvastatin] Other (See Comments)    Reaction: Muscle pain   . Naprosyn [Naproxen] Hives  . Hydrocodone Rash     Physical Exam:   Vitals  Blood pressure 109/77, pulse 71, temperature 98.8 F (37.1 C), temperature source Oral, resp. rate 18, last menstrual period 12/28/2015, SpO2 98 %.   1. General Young African-American female lying in bed in NAD,     2. Normal affect and insight, Not Suicidal or Homicidal, Awake Alert, Oriented X 3.  3. No F.N deficits, ALL C.Nerves Intact, Strength 5/5 all 4 extremities, Sensation intact all 4 extremities, Plantars down going.  4. Ears and Eyes appear Normal, Conjunctivae clear, PERRLA. Moist Oral Mucosa. Right-sided tongue laceration about 2 cm in length, not bleeding anymore.  5. Supple  Neck, No JVD, No cervical lymphadenopathy appriciated, No Carotid Bruits.  6. Symmetrical Chest wall movement, Good air movement bilaterally, CTAB.  7. RRR, No Gallops, Rubs or Murmurs, No Parasternal Heave.  8. Positive Bowel Sounds, Abdomen Soft, No tenderness, No organomegaly appriciated,No rebound -guarding or rigidity.  9.  No Cyanosis, Normal Skin Turgor, No Skin Rash or Bruise.  10. Good muscle tone,  joints appear normal , no effusions, Normal ROM.  11. No Palpable Lymph Nodes in Neck or Axillae      Data Review:    CBC  Recent Labs Lab 12/28/15 1245  WBC 4.6  HGB 11.9*  HCT 35.5*  PLT 321  MCV 91.0  MCH 30.5  MCHC 33.5  RDW 13.8  LYMPHSABS 1.7  MONOABS 0.3  EOSABS 0.1  BASOSABS 0.0   ------------------------------------------------------------------------------------------------------------------  Chemistries   Recent Labs Lab 12/28/15 1245  NA 139  K 3.6  CL 108  CO2 25  GLUCOSE 88  BUN 12  CREATININE 0.80  CALCIUM 8.9  AST 13*  ALT 10*  ALKPHOS 35*  BILITOT 0.8   ------------------------------------------------------------------------------------------------------------------ CrCl cannot be calculated (Unknown ideal weight.). ------------------------------------------------------------------------------------------------------------------ No results for input(s): TSH, T4TOTAL, T3FREE, THYROIDAB in the last 72 hours.  Invalid input(s): FREET3  Coagulation profile No results for input(s): INR, PROTIME in the last 168 hours. ------------------------------------------------------------------------------------------------------------------- No results for input(s): DDIMER in the last 72 hours. -------------------------------------------------------------------------------------------------------------------  Cardiac Enzymes No results for input(s): CKMB, TROPONINI, MYOGLOBIN in the last 168 hours.  Invalid input(s):  CK ------------------------------------------------------------------------------------------------------------------ No results found for: BNP   ---------------------------------------------------------------------------------------------------------------  Urinalysis    Component Value Date/Time   COLORURINE YELLOW 07/31/2015 1936   APPEARANCEUR CLEAR 07/31/2015 1936   LABSPEC 1.026 07/31/2015 1936   PHURINE 6.0 07/31/2015 1936   GLUCOSEU NEGATIVE 07/31/2015 1936   HGBUR NEGATIVE 07/31/2015 1936  BILIRUBINUR NEGATIVE 07/31/2015 1936   KETONESUR NEGATIVE 07/31/2015 1936   PROTEINUR NEGATIVE 07/31/2015 1936   UROBILINOGEN 1.0 06/06/2015 1630   NITRITE NEGATIVE 07/31/2015 1936   LEUKOCYTESUR NEGATIVE 07/31/2015 1936    ----------------------------------------------------------------------------------------------------------------   Imaging Results:    Ct Head Wo Contrast  Result Date: 12/28/2015 CLINICAL DATA:  Pt would not talk about why she was here in ED while in ct- Quit talking after she verified her bday. "Per EMS- patient reports that she has been out of her seizure medication x 1 month. Patient's significant other reported that she has had several seizures this week and had 2 yesterday. Patient remains lethargic, but is alert and oriented x 4. Patient bit the right cheek,but would not allow EMS to assess." EXAM: CT HEAD WITHOUT CONTRAST TECHNIQUE: Contiguous axial images were obtained from the base of the skull through the vertex without intravenous contrast. COMPARISON:  08/19/2010 FINDINGS: The ventricles are normal in size and configuration. There are no parenchymal masses or mass effect, no evidence of an infarct, no extra-axial masses or abnormal fluid collections and no intracranial hemorrhage. The visualized sinuses and mastoid air cells are clear. No skull lesion. IMPRESSION: Normal unenhanced CT scan of the brain. Electronically Signed   By: Amie Portland M.D.   On:  12/28/2015 12:37    My personal review of EKG: Rhythm NSR,  no Acute ST changes   Assessment & Plan:    Principal Problem:   Seizures (HCC) Active Problems:   H/O medication noncompliance   Seizure (HCC)    1. Breakthrough seizures due to medication noncompliance. Patient extensively counseled, seizures have abated after IV Keppra, home medications resumed, head CT nonacute, neuro consulted. Patient will require referral to free clinic prior to discharge and case management consult to arrange for home medications prior to discharge. IV Ativan as needed.   2. Right-sided tongue laceration due to seizures. Supportive care. Clay liquids today. One-time IV steroid to prevent any massive swelling, thereafter Motrin.   DVT Prophylaxis  SCDs .  AM Labs Ordered, also please review Full Orders  Family Communication: Admission, patients condition and plan of care including tests being ordered have been discussed with the patient and fiance who indicate understanding and agree with the plan and Code Status.  Code Status Full  Likely DC to  Home 1-2 days  Condition GUARDED    Consults called: Neuro by ER    Admission status: Obs    Time spent in minutes : 35   SINGH,PRASHANT K M.D on 12/28/2015 at 3:54 PM  Between 7am to 7pm - Pager - [phone]. After 7pm go to [email or web address] - password [facility]  [facility] - Office  [phone]

## 2015-12-28 NOTE — ED Notes (Signed)
Unable to collect labs patient is not in the room 

## 2015-12-28 NOTE — ED Provider Notes (Signed)
[facility]-EMERGENCY DEPT Provider Note   CSN: [id number] Arrival date & time: 12/28/15  1053  First Provider Contact:  First MD Initiated Contact with Patient 12/28/15 1139      History   Chief Complaint Chief Complaint  Patient presents with  . Seizures    HPI Barbara Hodges is a 29 y.o. female.  The history is provided by medical records (Fiance). No language interpreter was used.  Seizures     Barbara Hodges is a 30 y.o. female  with a PMH of seizures who presents to the Emergency Department with fiance for seizures. Per fiance, patient has been out of lamictal and zonegran for a little over a month. Over the last 3 days, she has had five seizures all lasting 1-3 minutes. She typically returns to baseline mental status after 15-30 minutes, however the last seizure was at approx. 9am and she still has not returned to baseline (11:30). No recent illness per fiance.   Level V caveat applies 2/2 post-ictal, acuity of condition.   Past Medical History:  Diagnosis Date  . Anxiety   . Generalized headaches   . JME (juvenile myoclonic epilepsy) (HCC)    Age of 51  . Panic attack   . Seizures [facility])     Patient Active Problem List   Diagnosis Date Noted  . Seizures (HCC) 12/28/2015  . Seizure (HCC) 12/28/2015  . H/O medication noncompliance 03/31/2014  . JME (juvenile myoclonic epilepsy) (HCC) 03/04/2013    Past Surgical History:  Procedure Laterality Date  . TUBAL LIGATION      OB History    Gravida Para Term Preterm AB Living   2 1 1   1      SAB TAB Ectopic Multiple Live Births                   Home Medications    Prior to Admission medications   Medication Sig Start Date End Date Taking? Authorizing Provider  clonazePAM (KLONOPIN) 0.5 MG tablet Take 1 tablet (0.5 mg total) by mouth 2 (two) times daily as needed for anxiety. 11/28/14  Yes Roxy Horseman, PA-C  lamoTRIgine (LAMICTAL) 200 MG tablet Take 1 tablet (200 mg total) by mouth daily. 10/27/15  Yes Jeffrey  Hedges, PA-C  zonisamide (ZONEGRAN) 100 MG capsule Take 8 capsules (800 mg total) by mouth daily. 10/27/15  Yes Eyvonne Mechanic, PA-C    Family History Family History  Problem Relation Age of Onset  . Healthy Mother   . Other Father   . Asthma Brother   . Healthy Son   . Healthy Daughter     Social History Social History  Substance Use Topics  . Smoking status: Never Smoker  . Smokeless tobacco: Never Used  . Alcohol use Yes     Comment: occasionally     Allergies   Chocolate; Lipitor [atorvastatin]; Naprosyn [naproxen]; and Hydrocodone   Review of Systems Review of Systems  Unable to perform ROS: Acuity of condition (Post-ictal)  Neurological: Positive for seizures.     Physical Exam Updated Vital Signs BP 98/67   Pulse (!) 59   Temp 98.8 F (37.1 C) (Oral)   Resp 14   LMP 12/28/2015 (Exact Date)   SpO2 100%   Physical Exam  Constitutional: She appears well-developed and well-nourished.  HENT:  Head: Normocephalic.  Evidence of tongue biting on right lateral tongue.   Eyes: Pupils are equal, round, and reactive to light.  Cardiovascular: Normal rate, regular rhythm, normal heart sounds  and intact distal pulses.  Exam reveals no gallop and no friction rub.   No murmur heard. Pulmonary/Chest: Effort normal and breath sounds normal. No respiratory distress. She has no wheezes. She has no rales. She exhibits no tenderness.  Abdominal: Soft. Bowel sounds are normal. She exhibits no distension. There is no tenderness.  Musculoskeletal: She exhibits no edema.  Neurological:  Post-ictal. Arousable. Intermittently following simple commands.   Skin: Skin is warm and dry.  Nursing note and vitals reviewed.     ED Treatments / Results  Labs (all labs ordered are listed, but only abnormal results are displayed) Labs Reviewed  CBC WITH DIFFERENTIAL/PLATELET - Abnormal; Notable for the following:       Result Value   Hemoglobin 11.9 (*)    HCT 35.5 (*)    All  other components within normal limits  COMPREHENSIVE METABOLIC PANEL - Abnormal; Notable for the following:    AST 13 (*)    ALT 10 (*)    Alkaline Phosphatase 35 (*)    All other components within normal limits  ETHANOL  URINE RAPID DRUG SCREEN, HOSP PERFORMED  URINALYSIS, ROUTINE W REFLEX MICROSCOPIC (NOT AT [facility])  I-STAT BETA HCG BLOOD, ED ([facility], [facility], [facility] ONLY)    EKG  EKG Interpretation None       Radiology Ct Head Wo Contrast  Result Date: 12/28/2015 CLINICAL DATA:  Pt would not talk about why she was here in ED while in ct- Quit talking after she verified her bday. "Per EMS- patient reports that she has been out of her seizure medication x 1 month. Patient's significant other reported that she has had several seizures this week and had 2 yesterday. Patient remains lethargic, but is alert and oriented x 4. Patient bit the right cheek,but would not allow EMS to assess." EXAM: CT HEAD WITHOUT CONTRAST TECHNIQUE: Contiguous axial images were obtained from the base of the skull through the vertex without intravenous contrast. COMPARISON:  08/19/2010 FINDINGS: The ventricles are normal in size and configuration. There are no parenchymal masses or mass effect, no evidence of an infarct, no extra-axial masses or abnormal fluid collections and no intracranial hemorrhage. The visualized sinuses and mastoid air cells are clear. No skull lesion. IMPRESSION: Normal unenhanced CT scan of the brain. Electronically Signed   By: Amie Portland M.D.   On: 12/28/2015 12:37    Procedures Procedures (including critical care time)  Medications Ordered in ED Medications  LORazepam (ATIVAN) injection 2 mg (not administered)  0.9 %  sodium chloride infusion (not administered)  lamoTRIgine (LAMICTAL) tablet 200 mg (not administered)  zonisamide (ZONEGRAN) capsule 800 mg (not administered)  clonazePAM (KLONOPIN) tablet 0.5 mg (not administered)  levETIRAcetam (KEPPRA) 1,000 mg in sodium chloride 0.9 % 100  mL IVPB (0 mg Intravenous Stopped 12/28/15 1408)  lidocaine (XYLOCAINE) 2 % viscous mouth solution 15 mL (15 mLs Mouth/Throat Given 12/28/15 1417)     Initial Impression / Assessment and Plan / ED Course  I have reviewed the triage vital signs and the nursing notes.  Pertinent labs & imaging results that were available during my care of the patient were reviewed by me and considered in my medical decision making (see chart for details).  Clinical Course   Barbara Hodges is a 30 y.o. female with PMH of seizures who has been out of seizure meds for 1-2 months. Fiance at bedside reports 5 seizures over the last 3 days, most recently at 9 am. Upon arrival, patient is arousable  but still post-ictal and not able to answer questions. CT head and labs ordered.   CT unremarkable. Labs reviewed. Patient improving, but still post-ictal. Will consult hospitalist for admission.   Hospitalist to admit. Will consult neurology.   3:37 PM - Discussed case with neurology, Dr. Hilda BladesArmstrong, who recommends continuing to observe patient with frequent neuro checks until 9pm tonight. If at that point, patient is still post-ictal, neurology to be consulted and EEG performed. If patient returns to baseline mental status before 9pm, Dr. Hilda BladesArmstrong does not believe neuro service has anything additional to offer and encourage taking meds as directed and neuro follow up as outpatient.   Patient discussed with Dr. Madilyn Hookees who agrees with treatment plan.   Final Clinical Impressions(s) / ED Diagnoses   Final diagnoses:  Seizure [facility](HCC)    New Prescriptions New Prescriptions   No medications on file     [facility] Pilcher Tyrail Grandfield, PA-C 12/28/15 1543    Tilden FossaElizabeth Rees, MD 12/29/15 [phone]

## 2015-12-29 DIAGNOSIS — R569 Unspecified convulsions: Secondary | ICD-10-CM | POA: Diagnosis not present

## 2015-12-29 LAB — URINALYSIS, ROUTINE W REFLEX MICROSCOPIC
Bilirubin Urine: NEGATIVE
Glucose, UA: NEGATIVE mg/dL
KETONES UR: NEGATIVE mg/dL
LEUKOCYTES UA: NEGATIVE
NITRITE: NEGATIVE
PH: 8.5 — AB (ref 5.0–8.0)
Protein, ur: NEGATIVE mg/dL
SPECIFIC GRAVITY, URINE: 1.011 (ref 1.005–1.030)

## 2015-12-29 LAB — CBC
HEMATOCRIT: 36.6 % (ref 36.0–46.0)
Hemoglobin: 12.5 g/dL (ref 12.0–15.0)
MCH: 30.8 pg (ref 26.0–34.0)
MCHC: 34.2 g/dL (ref 30.0–36.0)
MCV: 90.1 fL (ref 78.0–100.0)
Platelets: 357 10*3/uL (ref 150–400)
RBC: 4.06 MIL/uL (ref 3.87–5.11)
RDW: 13.6 % (ref 11.5–15.5)
WBC: 4.3 10*3/uL (ref 4.0–10.5)

## 2015-12-29 LAB — URINE MICROSCOPIC-ADD ON

## 2015-12-29 LAB — BASIC METABOLIC PANEL
Anion gap: 6 (ref 5–15)
BUN: 8 mg/dL (ref 6–20)
CHLORIDE: 110 mmol/L (ref 101–111)
CO2: 21 mmol/L — ABNORMAL LOW (ref 22–32)
Calcium: 9.3 mg/dL (ref 8.9–10.3)
Creatinine, Ser: 0.87 mg/dL (ref 0.44–1.00)
GFR calc Af Amer: >60 mL/min (ref >60)
GFR calc non Af Amer: >60 mL/min (ref >60)
GLUCOSE: 107 mg/dL — AB (ref 65–99)
POTASSIUM: 4.1 mmol/L (ref 3.5–5.1)
Sodium: 137 mmol/L (ref 135–145)

## 2015-12-29 LAB — RAPID URINE DRUG SCREEN, HOSP PERFORMED
Amphetamines: NOT DETECTED
Barbiturates: NOT DETECTED
Benzodiazepines: NOT DETECTED
Cocaine: NOT DETECTED
OPIATES: NOT DETECTED
Tetrahydrocannabinol: POSITIVE — AB

## 2015-12-29 MED ORDER — LAMOTRIGINE 200 MG PO TABS: 200.0000 mg | ORAL_TABLET | Freq: Every day | ORAL | 0 refills | Status: DC

## 2015-12-29 MED ORDER — ZONISAMIDE 100 MG PO CAPS: 800.0000 mg | ORAL_CAPSULE | Freq: Every day | ORAL | 0 refills | Status: DC

## 2015-12-29 MED ORDER — HYDROCODONE-ACETAMINOPHEN 5-325 MG PO TABS: 1.0000 | ORAL_TABLET | Freq: Four times a day (QID) | ORAL | 0 refills | Status: DC | PRN

## 2015-12-29 NOTE — Progress Notes (Signed)
CSW consulted for patient medications, Please refer to floor RN CaseManager for patient medication needs.   LCSWA signing off.

## 2015-12-29 NOTE — Discharge Summary (Signed)
Barbara Hodges ZOX:[id number] DOB: 1986-02-16 DOA: 12/28/2015  PCP: No primary care provider on file.  Admit date: 12/28/2015  Discharge date: 12/29/2015  Admitted From: Home   Disposition:  Home   Recommendations for Outpatient Follow-up:   Follow up with PCP in 1-2 weeks  PCP Please obtain BMP/CBC, 2 view CXR in 1week,  (see Discharge instructions)   PCP Please follow up on the following pending results: None   Home Health: None   Equipment/Devices: None  Consultations: Neurologist on call over the phone by ED physician, neurologist recommended resumption of home medications as before and no further input. Discharge Condition: Stable  CODE STATUS: Full   Diet Recommendation: Soft   Chief Complaint  Patient presents with  . Seizures     Brief history of present illness from the day of admission and additional interim summary     Barbara Hodges  is a 30 y.o. 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 Patient was brought in for fiance after she had violent seizures at home with right-sided tongue bite, in the ER patient had a few more witnessed seizures and received IV Keppra after neuro was consulted over the phone. She was subsequently postictal and I was called to admit the patient.  The ER blood work and CT head unremarkable. Patient is now symptom free except for tongue pain after 10 bite.   Hospital issues addressed    1. Breakthrough seizures due to medication noncompliance. CT head nonacute, patient back to baseline he did Patient extensively counseled, seizures have abated after IV KeppraIn the ER, neurology was consulted by ED physician, neurologist recommended resumption of home medications and  nothing else to offer at this point. Patient is now seizure free. Home medications have been resumed and she has been provided 1 month supply. Case management has been consulted to assist patient finding a PCP and to help with her home medications.  2. Right-sided tongue laceration due to seizures. Supportive care. Soft diet and few days of Lortab as needed, instructed by me to do gentle Listerine mouthwash 3-4 times a day and to follow with PCP within a week.    Discharge diagnosis     Principal Problem:   Seizures (HCC) Active Problems:   H/O medication noncompliance   Seizure [facility])    Discharge instructions    Discharge Instructions    Discharge instructions    Complete by:  As directed   Do not drive, operate heavy machinery, perform activities at heights, swimming or participation in water activities or provide baby sitting services until you have seen by your Neurologist and advised to do so again.  Follow with Primary MD in 7 days   Get CBC, CMP, 2 view Chest X ray checked  by Primary MD or SNF MD in 5-7 days ( we routinely change or add medications that can affect your baseline labs and fluid status, therefore we recommend that you get  the mentioned basic workup next visit with your PCP, your PCP may decide not to get them or add new tests based on their clinical decision)   Activity: As tolerated with Full fall precautions use walker/cane & assistance as needed   Disposition Home    Diet:  Soft.  For Heart failure patients - Check your Weight same time everyday, if you gain over 2 pounds, or you develop in leg swelling, experience more shortness of breath or chest pain, call your Primary MD immediately. Follow Cardiac Low Salt Diet and 1.5 lit/day fluid restriction.   On your next visit with your primary care physician please Get Medicines reviewed and adjusted.   Please request your Prim.MD to go over all Hospital Tests and Procedure/Radiological results at the  follow up, please get all Hospital records sent to your Prim MD by signing hospital release before you go home.   If you experience worsening of your admission symptoms, develop shortness of breath, life threatening emergency, suicidal or homicidal thoughts you must seek medical attention immediately by calling 911 or calling your MD immediately  if symptoms less severe.  You Must read complete instructions/literature along with all the possible adverse reactions/side effects for all the Medicines you take and that have been prescribed to you. Take any new Medicines after you have completely understood and accpet all the possible adverse reactions/side effects.   Do not drive when taking Pain medications.    Do not take more than prescribed Pain, Sleep and Anxiety Medications  Special Instructions: If you have smoked or chewed Tobacco  in the last 2 yrs please stop smoking, stop any regular Alcohol  and or any Recreational drug use.  Wear Seat belts while driving.   Please note  You were cared for by a hospitalist during your hospital stay. If you have any questions about your discharge medications or the care you received while you were in the hospital after you are discharged, you can call the unit and asked to speak with the hospitalist on call if the hospitalist that took care of you is not available. Once you are discharged, your primary care physician will handle any further medical issues. Please note that NO REFILLS for any discharge medications will be authorized once you are discharged, as it is imperative that you return to your primary care physician (or establish a relationship with a primary care physician if you do not have one) for your aftercare needs so that they can reassess your need for medications and monitor your lab values.   Increase activity slowly    Complete by:  As directed      Discharge Medications     Medication List    STOP taking these medications     clonazePAM 0.5 MG tablet Commonly known as:  KLONOPIN     TAKE these medications   HYDROcodone-acetaminophen 5-325 MG tablet Commonly known as:  NORCO/VICODIN Take 1 tablet by mouth every 6 (six) hours as needed for moderate pain.   lamoTRIgine 200 MG tablet Commonly known as:  LAMICTAL Take 1 tablet (200 mg total) by mouth daily.   zonisamide 100 MG capsule Commonly known as:  ZONEGRAN Take 8 capsules (800 mg total) by mouth daily. dispense 30 day supply - adjust the total as needed What changed:  additional instructions       Follow-up Information    [facility]. Schedule an appointment as soon as possible for a visit in 1 week(s).  Contact information: [address] [place] [address] [phone]       [facility]. Schedule an appointment as soon as possible for a visit in 1 week(s).   Specialty:  Neurology Contact information: [address] [place] [address] [phone]          Major procedures and Radiology Reports - PLEASE review detailed and final reports thoroughly  -        Ct Head Wo Contrast  Result Date: 12/28/2015 CLINICAL DATA:  Pt would not talk about why she was here in ED while in ct- Quit talking after she verified her bday. "Per EMS- patient reports that she has been out of her seizure medication x 1 month. Patient's significant other reported that she has had several seizures this week and had 2 yesterday. Patient remains lethargic, but is alert and oriented x 4. Patient bit the right cheek,but would not allow EMS to assess." EXAM: CT HEAD WITHOUT CONTRAST TECHNIQUE: Contiguous axial images were obtained from the base of the skull through the vertex without intravenous contrast. COMPARISON:  08/19/2010 FINDINGS: The ventricles are normal in size and configuration. There are no parenchymal masses or mass effect, no evidence of an infarct, no  extra-axial masses or abnormal fluid collections and no intracranial hemorrhage. The visualized sinuses and mastoid air cells are clear. No skull lesion. IMPRESSION: Normal unenhanced CT scan of the brain. Electronically Signed   By: Amie Portland M.D.   On: 12/28/2015 12:37    Micro Results    No results found for this or any previous visit (from the past 240 hour(s)).  Today   Subjective    Barbara Hodges today has no headache,no chest abdominal pain,no new weakness tingling or numbness, feels much better wants to go home today.    Objective   Blood pressure 108/63, pulse 68, temperature 98.7 F (37.1 C), temperature source Oral, resp. rate 18, weight 70.2 kg (154 lb 12.2 oz), last menstrual period 12/28/2015, SpO2 100 %.   Intake/Output Summary (Last 24 hours) at 12/29/15 0748 Last data filed at 12/28/15 1600  Gross per 24 hour  Intake             6.25 ml  Output                0 ml  Net             6.25 ml    Exam Awake Alert, Oriented x 3, No new F.N deficits, Normal affect [place].AT,PERRAL Supple Neck,No JVD, No cervical lymphadenopathy appriciated. Right-sided tongue laceration site healing well Symmetrical Chest wall movement, Good air movement bilaterally, CTAB RRR,No Gallops,Rubs or new Murmurs, No Parasternal Heave +ve B.Sounds, Abd Soft, Non tender, No organomegaly appriciated, No rebound -guarding or rigidity. No Cyanosis, Clubbing or edema, No new Rash or bruise   Data Review   CBC w Diff: Lab Results  Component Value Date   WBC 4.3 12/29/2015   HGB 12.5 12/29/2015   HCT 36.6 12/29/2015   PLT 357 12/29/2015   LYMPHOPCT 38 12/28/2015   MONOPCT 7 12/28/2015   EOSPCT 3 12/28/2015   BASOPCT 0 12/28/2015    CMP: Lab Results  Component Value Date   NA 137 12/29/2015   K 4.1 12/29/2015   CL 110 12/29/2015   CO2 21 (L) 12/29/2015   BUN 8 12/29/2015   CREATININE 0.87 12/29/2015   CREATININE 0.85 03/31/2014   PROT 7.0 12/28/2015   ALBUMIN 3.9  12/28/2015    BILITOT 0.8 12/28/2015   ALKPHOS 35 (L) 12/28/2015   AST 13 (L) 12/28/2015   ALT 10 (L) 12/28/2015  .   Total Time in preparing paper work, data evaluation and todays exam - 35 minutes  Leroy SeaSINGH,Zyra Parrillo K M.D on 12/29/2015 at 7:48 AM  [facility]   Office  [phone]

## 2015-12-29 NOTE — Care Management Note (Signed)
Case Management Note  Patient Details  Name: SHIZUKO WOJDYLA MRN: [id number] Date of Birth: [birth year]  Subjective/Objective:       30 yo admitted with Seizures             Action/Plan: Pt from home with significant other. This CM met with pt at bedside who states she has been having trouble obtaining a PCP as her current MD has just retired. Pt encouraged to call Medicaid to see who she has been assigned to. Pt also given information on [facility] and other clinics in the area that accept Medicaid. Pt encouraged to call [facility] and get appointment asap. Pt states she will do this. No other CM needs communicated.  Expected Discharge Date:   (unknown)               Expected Discharge Plan:  Home/Self Care  In-House Referral:     Discharge planning Services  CM Consult, [facility]  Post Acute Care Choice:    Choice offered to:     DME Arranged:    DME Agency:     HH Arranged:    HH Agency:     Status of Service:  Completed, signed off  If discussed at [employer] of Stay Meetings, dates discussed:    Additional CommentsLynnell Catalan, RN 12/29/2015, 10:19 AM  [phone]

## 2015-12-29 NOTE — Progress Notes (Signed)
Patient given discharge instructions, and verbalized an understanding of all discharge instructions.  Patient agrees with discharge plan, and is being discharged in stable medical condition.  Patient given transportation via wheelchair. 

## 2015-12-29 NOTE — Discharge Instructions (Signed)
Do not drive, operate heavy machinery, perform activities at heights, swimming or participation in water activities or provide baby sitting services until you have seen by your Neurologist and advised to do so again.  Follow with Primary MD in 7 days   Get CBC, CMP, 2 view Chest X ray checked  by Primary MD or SNF MD in 5-7 days ( we routinely change or add medications that can affect your baseline labs and fluid status, therefore we recommend that you get the mentioned basic workup next visit with your PCP, your PCP may decide not to get them or add new tests based on their clinical decision)   Activity: As tolerated with Full fall precautions use walker/cane & assistance as needed   Disposition Home    Diet:  Soft.  For Heart failure patients - Check your Weight same time everyday, if you gain over 2 pounds, or you develop in leg swelling, experience more shortness of breath or chest pain, call your Primary MD immediately. Follow Cardiac Low Salt Diet and 1.5 lit/day fluid restriction.   On your next visit with your primary care physician please Get Medicines reviewed and adjusted.   Please request your Prim.MD to go over all Hospital Tests and Procedure/Radiological results at the follow up, please get all Hospital records sent to your Prim MD by signing hospital release before you go home.   If you experience worsening of your admission symptoms, develop shortness of breath, life threatening emergency, suicidal or homicidal thoughts you must seek medical attention immediately by calling 911 or calling your MD immediately  if symptoms less severe.  You Must read complete instructions/literature along with all the possible adverse reactions/side effects for all the Medicines you take and that have been prescribed to you. Take any new Medicines after you have completely understood and accpet all the possible adverse reactions/side effects.   Do not drive when taking Pain medications.     Do not take more than prescribed Pain, Sleep and Anxiety Medications  Special Instructions: If you have smoked or chewed Tobacco  in the last 2 yrs please stop smoking, stop any regular Alcohol  and or any Recreational drug use.  Wear Seat belts while driving.   Please note  You were cared for by a hospitalist during your hospital stay. If you have any questions about your discharge medications or the care you received while you were in the hospital after you are discharged, you can call the unit and asked to speak with the hospitalist on call if the hospitalist that took care of you is not available. Once you are discharged, your primary care physician will handle any further medical issues. Please note that NO REFILLS for any discharge medications will be authorized once you are discharged, as it is imperative that you return to your primary care physician (or establish a relationship with a primary care physician if you do not have one) for your aftercare needs so that they can reassess your need for medications and monitor your lab values.

## 2015-12-29 NOTE — Progress Notes (Signed)
Per Dr. Doristine ChurchSingh's request about loading of pt's home antiepileptics, neither zonisamide and lamotrigine require loading doses.   Adalberto ColeNikola Janette Harvie, PharmD, BCPS Pager [phone] 12/29/2015 8:04 AM

## 2016-01-04 ENCOUNTER — Ambulatory Visit: Payer: Medicaid Other

## 2016-01-11 ENCOUNTER — Encounter: Payer: Self-pay | Admitting: Physician Assistant

## 2016-01-11 ENCOUNTER — Ambulatory Visit: Payer: Medicaid Other | Attending: Internal Medicine | Admitting: Physician Assistant

## 2016-01-11 VITALS — BP 105/70 | HR 58 | Temp 98.1°F | Resp 16 | Wt 138.6 lb

## 2016-01-11 DIAGNOSIS — G40909 Epilepsy, unspecified, not intractable, without status epilepticus: Secondary | ICD-10-CM | POA: Insufficient documentation

## 2016-01-11 DIAGNOSIS — R51 Headache: Secondary | ICD-10-CM | POA: Insufficient documentation

## 2016-01-11 DIAGNOSIS — Z79899 Other long term (current) drug therapy: Secondary | ICD-10-CM | POA: Diagnosis not present

## 2016-01-11 DIAGNOSIS — F1290 Cannabis use, unspecified, uncomplicated: Secondary | ICD-10-CM | POA: Diagnosis not present

## 2016-01-11 DIAGNOSIS — F411 Generalized anxiety disorder: Secondary | ICD-10-CM | POA: Diagnosis not present

## 2016-01-11 DIAGNOSIS — R569 Unspecified convulsions: Secondary | ICD-10-CM | POA: Diagnosis not present

## 2016-01-11 DIAGNOSIS — Z91018 Allergy to other foods: Secondary | ICD-10-CM | POA: Insufficient documentation

## 2016-01-11 DIAGNOSIS — Z885 Allergy status to narcotic agent status: Secondary | ICD-10-CM | POA: Diagnosis not present

## 2016-01-11 DIAGNOSIS — Z888 Allergy status to other drugs, medicaments and biological substances status: Secondary | ICD-10-CM | POA: Insufficient documentation

## 2016-01-11 LAB — CBC WITH DIFFERENTIAL/PLATELET
BASOS PCT: 0 %
Basophils Absolute: 0 cells/uL (ref 0–200)
EOS PCT: 2 %
Eosinophils Absolute: 116 cells/uL (ref 15–500)
HCT: 38.5 % (ref 35.0–45.0)
Hemoglobin: 13 g/dL (ref 11.7–15.5)
LYMPHS ABS: 1914 {cells}/uL (ref 850–3900)
LYMPHS PCT: 33 %
MCH: 30.7 pg (ref 27.0–33.0)
MCHC: 33.8 g/dL (ref 32.0–36.0)
MCV: 91 fL (ref 80.0–100.0)
MPV: 10 fL (ref 7.5–12.5)
Monocytes Absolute: 406 cells/uL (ref 200–950)
Monocytes Relative: 7 %
NEUTROS PCT: 58 %
Neutro Abs: 3364 cells/uL (ref 1500–7800)
PLATELETS: 384 10*3/uL (ref 140–400)
RBC: 4.23 MIL/uL (ref 3.80–5.10)
RDW: 15 % (ref 11.0–15.0)
WBC: 5.8 10*3/uL (ref 3.8–10.8)

## 2016-01-11 LAB — COMPREHENSIVE METABOLIC PANEL
ALBUMIN: 4.6 g/dL (ref 3.6–5.1)
ALK PHOS: 36 U/L (ref 33–115)
ALT: 7 U/L (ref 6–29)
AST: 12 U/L (ref 10–30)
BUN: 10 mg/dL (ref 7–25)
CALCIUM: 9.7 mg/dL (ref 8.6–10.2)
CO2: 20 mmol/L (ref 20–31)
Chloride: 111 mmol/L — ABNORMAL HIGH (ref 98–110)
Creat: 0.83 mg/dL (ref 0.50–1.10)
GLUCOSE: 90 mg/dL (ref 65–99)
POTASSIUM: 4.2 mmol/L (ref 3.5–5.3)
Sodium: 140 mmol/L (ref 135–146)
Total Bilirubin: 0.3 mg/dL (ref 0.2–1.2)
Total Protein: 7.5 g/dL (ref 6.1–8.1)

## 2016-01-11 MED ORDER — LAMOTRIGINE 200 MG PO TABS: 200.0000 mg | ORAL_TABLET | Freq: Every day | ORAL | 3 refills | Status: AC

## 2016-01-11 MED ORDER — ZONISAMIDE 100 MG PO CAPS: ORAL_CAPSULE | ORAL | 3 refills | Status: AC

## 2016-01-11 NOTE — Progress Notes (Signed)
Barbara Hodges, is a 30 y.o. female  WUJ:[id number]  NFA:[id number]  DOB - 01-Mar-1986  Subjective:  Chief Complaint and HPI: Barbara Hodges is a 30 y.o. female here today to establish care and for a follow up visit after running out of her seizure medications and having a seizure.  She was seen and treated in the ED on 12/28/2015.  She had been out of her medications about 6 weeks when she went to the ED.  She is usu well controlled on Lamictal 200mg  and Zonegran 800mg  daily.  These meds were restarted in the ED.   She is not having any SOB or edema or s/sx of volume overload since resuming her medications.   She does admit to a fair amount of anxiety.  She often starts to worry about things at night and will stay up late or even all night.  She has trouble eating because of her anxiety and usu skips meals and eats once a day.  She admits to poor coping mechanisms overall.  She smokes marijuana often bc it eases her anxiety.  If she starts worrying about something, she is unable to stop worrying.  She has a boyfriend and children that live in her home.  BF is supportive. She has never been to counseling.  Drinks no alcohol; doesn't use any other substances.  Denies SI/HI.   ED notes/labs reviewed.     ROS:   Constitutional:  No f/c, No night sweats, No unexplained weight loss. EENT:  No vision changes, No blurry vision, No hearing changes. No mouth, throat, or ear problems.  Respiratory: No cough, No SOB Cardiac: No CP, no palpitations GI:  No abd pain, No N/V/D. GU: No Urinary s/sx Musculoskeletal: No joint pain Neuro: No headache, no dizziness, no motor weakness.  Skin: No rash Endocrine:  No polydipsia. No polyuria.  Psych: Denies SI/HI  No problems updated.  ALLERGIES: Allergies  Allergen Reactions  . Chocolate Anaphylaxis  . Lipitor [Atorvastatin] Other (See Comments)    Reaction: Muscle pain   . Naprosyn [Naproxen] Hives  . Hydrocodone Rash    PAST MEDICAL HISTORY: Past  Medical History:  Diagnosis Date  . Anxiety   . Generalized headaches   . 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 Panic attack   . Seizures (HCC)     MEDICATIONS AT HOME: Prior to Admission medications   Medication Sig Start Date End Date Taking? Authorizing Provider  HYDROcodone-acetaminophen (NORCO/VICODIN) 5-325 MG tablet Take 1 tablet by mouth every 6 (six) hours as needed for moderate pain. 12/29/15  Yes Leroy Sea, MD  lamoTRIgine (LAMICTAL) 200 MG tablet Take 1 tablet (200 mg total) by mouth daily. 01/11/16  Yes Anders Simmonds, PA-C  zonisamide (ZONEGRAN) 100 MG capsule Take 800mg  daily (8 tabs) 01/11/16  Yes Anders Simmonds, PA-C     Objective:  EXAM:   Vitals:   01/11/16 0929  BP: 105/70  Pulse: (!) 58  Resp: 16  Temp: 98.1 F (36.7 C)  TempSrc: Oral  SpO2: 100%  Weight: 138 lb 9.6 oz (62.9 kg)    General appearance : A&OX3. NAD. Non-toxic-appearing. + Anxious HEENT: Atraumatic and Normocephalic.  PERRLA. EOM intact.  TM clear B. Mouth-MMM, post pharynx WNL w/o erythema, No PND. Neck: supple, no JVD. No cervical lymphadenopathy. No thyromegaly Chest/Lungs:  Breathing-non-labored, Good air entry bilaterally, breath sounds normal without rales, rhonchi, or wheezing  CVS: S1 S2 regular, no murmurs, gallops, rubs  Extremities: Bilateral Lower Ext shows no edema, both legs are warm to touch with = pulse throughout Neurology:  CN II-XII grossly intact, Non focal.   Psych:  TP linear. J/I WNL. Normal speech. Appropriate eye contact and affect.  Skin:  No Rash  Data Review No results found for: HGBA1C   Assessment & Plan   1. Anxiety state Counseled on Deep breathing/relaxation techniques, proper nutrition(she is often skipping meals/only eating once per day), practicing affirmative thoughts, marijuana cessation advised. Consider SSRI if seizure meds are at therapeutic levels in the future. - Ambulatory referral to Psychology  2. Seizures  (HCC)  Check labs-meds restarted ~2 weeks ago - Lamotrigine level - Comprehensive metabolic panel - Zonisamide level - CBC with Differential/Platelet  Patient have been counseled extensively about nutrition and exercise  Return in about 4 weeks (around 02/08/2016) for f/up with me for anxiety; seizure disorder; and appetite.  The patient was given clear instructions to go to ER or return to medical center if symptoms don't improve, worsen or new problems develop. The patient verbalized understanding. The patient was told to call to get lab results if they haven't heard anything in the next week.     Georgian CoAngela Dannisha Eckmann, PA-C [facility] [place], [place] [phone]   01/11/2016, 10:04 AM

## 2016-01-11 NOTE — Progress Notes (Signed)
Pt is in the office today for hospital f/u Pt states she was in the hospital for 2 days for seizures Pt states her pain level today in the office is a 8 Pt states her pain is coming from her mouth(tooth)

## 2016-01-13 LAB — LAMOTRIGINE LEVEL: Lamotrigine Lvl: 6.6 ug/mL (ref 4.0–18.0)

## 2016-01-15 ENCOUNTER — Telehealth: Payer: Self-pay

## 2016-01-15 NOTE — Telephone Encounter (Signed)
Contacted pt to go over lab results was unable to get in touch with pt also was unable to lvm for pt

## 2016-01-16 LAB — ZONISAMIDE LEVEL: Zonisamide: 40.3 ug/mL — ABNORMAL HIGH (ref 10.0–40.0)

## 2016-01-17 ENCOUNTER — Telehealth: Payer: Self-pay

## 2016-01-17 NOTE — Telephone Encounter (Signed)
Contacted pt to go over lab results was unable to get in touch with pt also was unable to lvm for pt will be mailing letter out today

## 2016-01-31 NOTE — Progress Notes (Signed)
Call placed to patient, s/w female states patient unavailable. Left message requesting return call.

## 2016-01-31 NOTE — Progress Notes (Signed)
"  Unable to reach you" letter sent

## 2016-02-08 ENCOUNTER — Ambulatory Visit: Payer: Medicaid Other | Admitting: Family Medicine

## 2016-03-18 ENCOUNTER — Ambulatory Visit: Payer: Medicaid Other | Admitting: Family Medicine

## 2016-04-10 ENCOUNTER — Ambulatory Visit (HOSPITAL_COMMUNITY)
Admission: EM | Admit: 2016-04-10 | Discharge: 2016-04-10 | Disposition: A | Discharge status: Home / Self Care | Payer: Medicaid Other | Attending: Internal Medicine | Admitting: Internal Medicine

## 2016-04-10 ENCOUNTER — Encounter (HOSPITAL_COMMUNITY): Payer: Self-pay | Admitting: Emergency Medicine

## 2016-04-10 DIAGNOSIS — S61215A Laceration without foreign body of left ring finger without damage to nail, initial encounter: Secondary | ICD-10-CM | POA: Diagnosis not present

## 2016-04-10 MED ORDER — CEPHALEXIN 500 MG PO CAPS: 500.0000 mg | ORAL_CAPSULE | Freq: Four times a day (QID) | ORAL | 0 refills | Status: DC
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
## 2016-04-10 NOTE — ED Triage Notes (Signed)
Laceration to left ring finger tip.  Was cutting yams.  Bleeding controlled.  Laceration along tip of nail and finger tip

## 2016-06-15 ENCOUNTER — Encounter (HOSPITAL_COMMUNITY): Payer: Self-pay

## 2016-06-15 ENCOUNTER — Emergency Department (HOSPITAL_COMMUNITY)
Admission: EM | Admit: 2016-06-15 | Discharge: 2016-06-15 | Disposition: A | Discharge status: Home / Self Care | Payer: Medicaid Other | Attending: Emergency Medicine | Admitting: Emergency Medicine

## 2016-06-15 DIAGNOSIS — R51 Headache: Secondary | ICD-10-CM | POA: Diagnosis not present

## 2016-06-15 DIAGNOSIS — R519 Headache, unspecified: Secondary | ICD-10-CM

## 2016-06-15 LAB — POC URINE PREG, ED: Preg Test, Ur: NEGATIVE

## 2016-06-15 LAB — URINALYSIS, ROUTINE W REFLEX MICROSCOPIC
BILIRUBIN URINE: NEGATIVE
GLUCOSE, UA: NEGATIVE mg/dL
Hgb urine dipstick: NEGATIVE
KETONES UR: NEGATIVE mg/dL
Leukocytes, UA: NEGATIVE
NITRITE: NEGATIVE
PH: 8 (ref 5.0–8.0)
PROTEIN: NEGATIVE mg/dL
Specific Gravity, Urine: 1.027 (ref 1.005–1.030)

## 2016-06-15 MED ORDER — KETOROLAC TROMETHAMINE 30 MG/ML IJ SOLN
30.0000 mg | Freq: Once | INTRAMUSCULAR | Status: AC
  Administered 2016-06-15: 30 mg via INTRAVENOUS
  Filled 2016-06-15: qty 1

## 2016-06-15 MED ORDER — DIPHENHYDRAMINE HCL 50 MG/ML IJ SOLN
25.0000 mg | Freq: Once | INTRAMUSCULAR | Status: AC
  Administered 2016-06-15: 25 mg via INTRAVENOUS
  Filled 2016-06-15: qty 1

## 2016-06-15 MED ORDER — METOCLOPRAMIDE HCL 5 MG/ML IJ SOLN
10.0000 mg | Freq: Once | INTRAMUSCULAR | Status: AC
  Administered 2016-06-15: 10 mg via INTRAVENOUS
  Filled 2016-06-15: qty 2

## 2016-06-15 NOTE — ED Notes (Signed)
In passing, I notice pt. Is not in her room. I straighten her linen for her return.
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
## 2016-06-15 NOTE — ED Triage Notes (Signed)
Pt complains of not feeling well for three weeks, she states that she has a very bad headache and she's nauseated

## 2016-07-11 ENCOUNTER — Ambulatory Visit (HOSPITAL_COMMUNITY)
Admission: EM | Admit: 2016-07-11 | Discharge: 2016-07-11 | Disposition: A | Discharge status: Home / Self Care | Payer: Medicaid Other | Attending: Family Medicine | Admitting: Family Medicine

## 2016-07-11 ENCOUNTER — Encounter (HOSPITAL_COMMUNITY): Payer: Self-pay | Admitting: *Deleted

## 2016-07-11 DIAGNOSIS — T23291A Burn of second degree of multiple sites of right wrist and hand, initial encounter: Secondary | ICD-10-CM

## 2016-07-11 MED ORDER — HYDROCODONE-ACETAMINOPHEN 5-325 MG PO TABS
ORAL_TABLET | ORAL | Status: AC
  Filled 2016-07-11: qty 1

## 2016-07-11 MED ORDER — HYDROCODONE-ACETAMINOPHEN 5-325 MG PO TABS
1.0000 | ORAL_TABLET | Freq: Once | ORAL | Status: AC
  Administered 2016-07-11: 1 via ORAL

## 2016-07-11 MED ORDER — ACETAMINOPHEN 325 MG PO TABS
650.0000 mg | ORAL_TABLET | Freq: Once | ORAL | Status: AC
  Administered 2016-07-11: 650 mg via ORAL

## 2016-07-11 MED ORDER — HYDROCODONE-ACETAMINOPHEN 5-325 MG PO TABS: 1.0000 | ORAL_TABLET | Freq: Four times a day (QID) | ORAL | 0 refills | Status: AC | PRN

## 2016-07-11 MED ORDER — SILVER SULFADIAZINE 1 % EX CREA
TOPICAL_CREAM | CUTANEOUS | Status: AC
  Filled 2016-07-11: qty 85

## 2016-07-11 MED ORDER — SILVER SULFADIAZINE 1 % EX CREA: 1.0000 "application " | TOPICAL_CREAM | Freq: Every day | CUTANEOUS | 0 refills | Status: DC

## 2016-07-11 MED ORDER — ACETAMINOPHEN 325 MG PO TABS
ORAL_TABLET | ORAL | Status: AC
  Filled 2016-07-11: qty 2

## 2016-07-11 MED ORDER — SILVER SULFADIAZINE 1 % EX CREA
TOPICAL_CREAM | Freq: Once | CUTANEOUS | Status: AC
Start: 2016-07-11 — End: 2016-07-11
  Administered 2016-07-11: 13:00:00 via TOPICAL
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
## 2016-08-04 ENCOUNTER — Ambulatory Visit (HOSPITAL_COMMUNITY)
Admission: EM | Admit: 2016-08-04 | Discharge: 2016-08-04 | Disposition: A | Discharge status: Home / Self Care | Payer: Medicaid Other | Attending: Radiology | Admitting: Radiology

## 2016-08-04 ENCOUNTER — Encounter (HOSPITAL_COMMUNITY): Payer: Self-pay | Admitting: Emergency Medicine

## 2016-08-04 DIAGNOSIS — B9689 Other specified bacterial agents as the cause of diseases classified elsewhere: Secondary | ICD-10-CM | POA: Insufficient documentation

## 2016-08-04 DIAGNOSIS — N760 Acute vaginitis: Secondary | ICD-10-CM | POA: Diagnosis not present

## 2016-08-04 DIAGNOSIS — N898 Other specified noninflammatory disorders of vagina: Secondary | ICD-10-CM

## 2016-08-04 DIAGNOSIS — L298 Other pruritus: Secondary | ICD-10-CM | POA: Diagnosis not present

## 2016-08-04 MED ORDER — ACYCLOVIR 200 MG PO CAPS: 200.0000 mg | ORAL_CAPSULE | Freq: Four times a day (QID) | ORAL | 0 refills | Status: AC

## 2016-08-04 NOTE — Discharge Instructions (Signed)
Patient will be call for further instruction should any test come back positive
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
## 2016-08-04 NOTE — ED Triage Notes (Signed)
[facility] a [facility] it[facility] x 4 days.

## 2016-08-04 NOTE — ED Notes (Signed)
Patient discharged by provider.

## 2016-08-05 LAB — URINE CYTOLOGY ANCILLARY ONLY
CHLAMYDIA, DNA PROBE: NEGATIVE
NEISSERIA GONORRHEA: NEGATIVE
TRICH (WINDOWPATH): NEGATIVE

## 2016-08-07 LAB — HSV CULTURE AND TYPING

## 2016-08-08 LAB — URINE CYTOLOGY ANCILLARY ONLY
Bacterial vaginitis: POSITIVE — AB
Candida vaginitis: NEGATIVE

## 2016-09-21 ENCOUNTER — Emergency Department (HOSPITAL_COMMUNITY): Payer: Medicaid Other

## 2016-09-21 ENCOUNTER — Emergency Department (HOSPITAL_COMMUNITY)
Admission: EM | Admit: 2016-09-21 | Discharge: 2016-09-21 | Disposition: A | Discharge status: Home / Self Care | Payer: Medicaid Other | Attending: Emergency Medicine | Admitting: Emergency Medicine

## 2016-09-21 ENCOUNTER — Encounter (HOSPITAL_COMMUNITY): Payer: Self-pay | Admitting: Nurse Practitioner

## 2016-09-21 DIAGNOSIS — Y999 Unspecified external cause status: Secondary | ICD-10-CM | POA: Insufficient documentation

## 2016-09-21 DIAGNOSIS — Z79899 Other long term (current) drug therapy: Secondary | ICD-10-CM | POA: Insufficient documentation

## 2016-09-21 DIAGNOSIS — S61011A Laceration without foreign body of right thumb without damage to nail, initial encounter: Secondary | ICD-10-CM | POA: Insufficient documentation

## 2016-09-21 DIAGNOSIS — Y939 Activity, unspecified: Secondary | ICD-10-CM | POA: Diagnosis not present

## 2016-09-21 DIAGNOSIS — Y929 Unspecified place or not applicable: Secondary | ICD-10-CM | POA: Insufficient documentation

## 2016-09-21 DIAGNOSIS — W25XXXA Contact with sharp glass, initial encounter: Secondary | ICD-10-CM | POA: Diagnosis not present

## 2016-09-21 DIAGNOSIS — S6991XA Unspecified injury of right wrist, hand and finger(s), initial encounter: Secondary | ICD-10-CM

## 2016-09-21 MED ORDER — CLINDAMYCIN HCL 150 MG PO CAPS: 150.0000 mg | ORAL_CAPSULE | Freq: Four times a day (QID) | ORAL | 0 refills | Status: AC

## 2016-09-21 MED ORDER — TRAMADOL HCL 50 MG PO TABS: 50.0000 mg | ORAL_TABLET | Freq: Four times a day (QID) | ORAL | 0 refills | Status: DC | PRN
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
## 2016-09-21 NOTE — Discharge Instructions (Signed)
Take clindamycin as directed for one week. Follow up with hand specialist for further  evaluation. Return to ED for worsening pain, increased drainage, increased bleeding, additional injury, loss of sensation.
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
## 2016-11-10 ENCOUNTER — Emergency Department (HOSPITAL_COMMUNITY)
Admission: EM | Admit: 2016-11-10 | Discharge: 2016-11-10 | Discharge status: Against Medical Advice | Payer: Medicaid Other

## 2016-11-10 NOTE — ED Notes (Signed)
Patient not in the room when nurse went in to triage pain.

## 2017-01-19 ENCOUNTER — Ambulatory Visit (INDEPENDENT_AMBULATORY_CARE_PROVIDER_SITE_OTHER): Payer: Medicaid Other

## 2017-01-19 ENCOUNTER — Encounter (HOSPITAL_COMMUNITY): Payer: Self-pay | Admitting: *Deleted

## 2017-01-19 ENCOUNTER — Ambulatory Visit (HOSPITAL_COMMUNITY)
Admission: EM | Admit: 2017-01-19 | Discharge: 2017-01-19 | Disposition: A | Discharge status: Home / Self Care | Payer: Medicaid Other | Attending: Family Medicine | Admitting: Family Medicine

## 2017-01-19 DIAGNOSIS — W208XXA Other cause of strike by thrown, projected or falling object, initial encounter: Secondary | ICD-10-CM

## 2017-01-19 DIAGNOSIS — S92425A Nondisplaced fracture of distal phalanx of left great toe, initial encounter for closed fracture: Secondary | ICD-10-CM

## 2017-01-19 MED ORDER — HYDROCODONE-ACETAMINOPHEN 5-325 MG PO TABS: 1.0000 | ORAL_TABLET | Freq: Four times a day (QID) | ORAL | 0 refills | Status: DC | PRN

## 2017-01-19 NOTE — ED Provider Notes (Signed)
[facility]    CSN: [id number] Arrival date & time: 01/19/17  1714     History   Chief Complaint Chief Complaint  Patient presents with  . Foot Pain    HPI Barbara Hodges is a 31 y.o. female.   HPI Pt was moving furniture earlier today and dropped a side table on her R great toe. It started to bleed under her toenail and she had immediate pain. She is unable to ambulate. No numbness or tingling. She has not yet tried anything for the pain.   Past Medical History:  Diagnosis Date  . Anxiety   . Generalized headaches   . 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 Panic attack   . Seizures [facility])     Patient Active Problem List   Diagnosis Date Noted  . Seizures (HCC) 12/28/2015  . Seizure (HCC) 12/28/2015  . H/O medication noncompliance 03/31/2014  . JME (juvenile myoclonic epilepsy) (HCC) 03/04/2013    Past Surgical History:  Procedure Laterality Date  . TUBAL LIGATION      OB History    Gravida Para Term Preterm AB Living   2 1 1   1      SAB TAB Ectopic Multiple Live Births                   Home Medications    Prior to Admission medications   Medication Sig Start Date End Date Taking? Authorizing Provider  HYDROcodone-acetaminophen (NORCO/VICODIN) 5-325 MG tablet Take 1-2 tablets by mouth every 6 (six) hours as needed for moderate pain or severe pain. 01/19/17   Sharlene Dory, DO  lamoTRIgine (LAMICTAL) 200 MG tablet Take 1 tablet (200 mg total) by mouth daily. 01/11/16   Anders Simmonds, PA-C  traMADol (ULTRAM) 50 MG tablet Take 1 tablet (50 mg total) by mouth every 6 (six) hours as needed. 09/21/16   Khatri, Hina, PA-C  zonisamide (ZONEGRAN) 100 MG capsule Take 800mg  daily (8 tabs) Patient taking differently: Take 800 mg by mouth daily. Take 800mg  daily (8 tabs) 01/11/16   Anders Simmonds, PA-C    Family History Family History  Problem Relation Age of Onset  . Healthy Mother   . Other Father   . Asthma Brother   .  Healthy Son   . Healthy Daughter     Social History Social History  Substance Use Topics  . Smoking status: Never Smoker  . Smokeless tobacco: Never Used  . Alcohol use Yes     Comment: occasionally     Allergies   Chocolate and Naprosyn [naproxen]   Review of Systems Review of Systems   Physical Exam Triage Vital Signs ED Triage Vitals  Enc Vitals Group     BP 01/19/17 1824 131/78     Pulse Rate 01/19/17 1824 98     Resp 01/19/17 1824 20     Temp 01/19/17 1824 98.9 F (37.2 C)     Temp Source 01/19/17 1824 Oral     SpO2 01/19/17 1824 97 %     Pain Score 01/19/17 1815 10   Updated Vital Signs BP 131/78   Pulse 98   Temp 98.9 F (37.2 C) (Oral)   Resp 20   LMP 01/17/2017 (Exact Date)   SpO2 97%   Physical Exam  Constitutional: She is oriented to person, place, and time. She appears well-developed and well-nourished.  Pulmonary/Chest: No respiratory distress.  Musculoskeletal:  TTP over 1st digit  on L foot, no TTP over 2nd digit or MT  Neurological: She is alert and oriented to person, place, and time. She exhibits normal muscle tone.  Skin: Skin is warm and dry. Capillary refill takes less than 2 seconds.  subung hematoma noted on 1st great toe on L  Psychiatric: She has a normal mood and affect. Judgment normal.     UC Treatments / Results  Radiology Dg Foot Complete Left  Result Date: 01/19/2017 CLINICAL DATA:  Acute left foot pain following table injury today. Initial encounter. EXAM: LEFT FOOT - COMPLETE 3+ VIEW COMPARISON:  None. FINDINGS: A nondisplaced fracture at the medial base of the great toe distal phalanx extends to the interphalangeal joint. No other fractures are noted. No subluxation or dislocation identified. No suspicious bony lesions are present. IMPRESSION: Nondisplaced intra-articular fracture at the base of the great toe distal phalanx. Electronically Signed   By: Harmon PierJeffrey  Hu M.D.   On: 01/19/2017 19:00    Procedures Procedures  none  Initial Impression / Assessment and Plan / UC Course  I have reviewed the triage vital signs and the nursing notes.  Pertinent labs & imaging results that were available during my care of the patient were reviewed by me and considered in my medical decision making (see chart for details).     31 yo BF presents with traumatic toe injury resulted in nondisplaced fx of distal phalanx. Flat soled shoe given with crutches. NSAIDs, ice, activity as tolerated. Opioids for breakthrough pain. F/u with PCP for management. She voiced understanding and agreement to the plan.   Final Clinical Impressions(s) / UC Diagnoses   Final diagnoses:  Closed nondisplaced fracture of distal phalanx of left great toe, initial encounter    New Prescriptions Discharge Medication List as of 01/19/2017  7:16 PM    START taking these medications   Details  HYDROcodone-acetaminophen (NORCO/VICODIN) 5-325 MG tablet Take 1-2 tablets by mouth every 6 (six) hours as needed for moderate pain or severe pain., Starting Sun 01/19/2017, Print         Controlled Substance Prescriptions  Controlled Substance Registry consulted? No   Sharlene DoryWendling, Nicholas Paul, [place] 01/19/17 2212
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
## 2017-01-19 NOTE — Discharge Instructions (Signed)
Ice/cold pack over area for 10-15 min every 2-3 hours while awake.  Ibuprofen 400-600 mg (2-3 over the counter strength tabs) every 6 hours as needed for pain.  Use crutches for as long as you need them to move around.  Do not take Norco when you are driving or if you need your wits about you.

## 2018-10-16 IMAGING — DX DG FOOT COMPLETE 3+V*L*
3 series · 3 of 3 positions shown · non-contrast
Comparison: None.

CLINICAL DATA: Acute left foot pain following table injury today.
Initial encounter.

EXAM:
LEFT FOOT - COMPLETE 3+ VIEW

[foot ap]
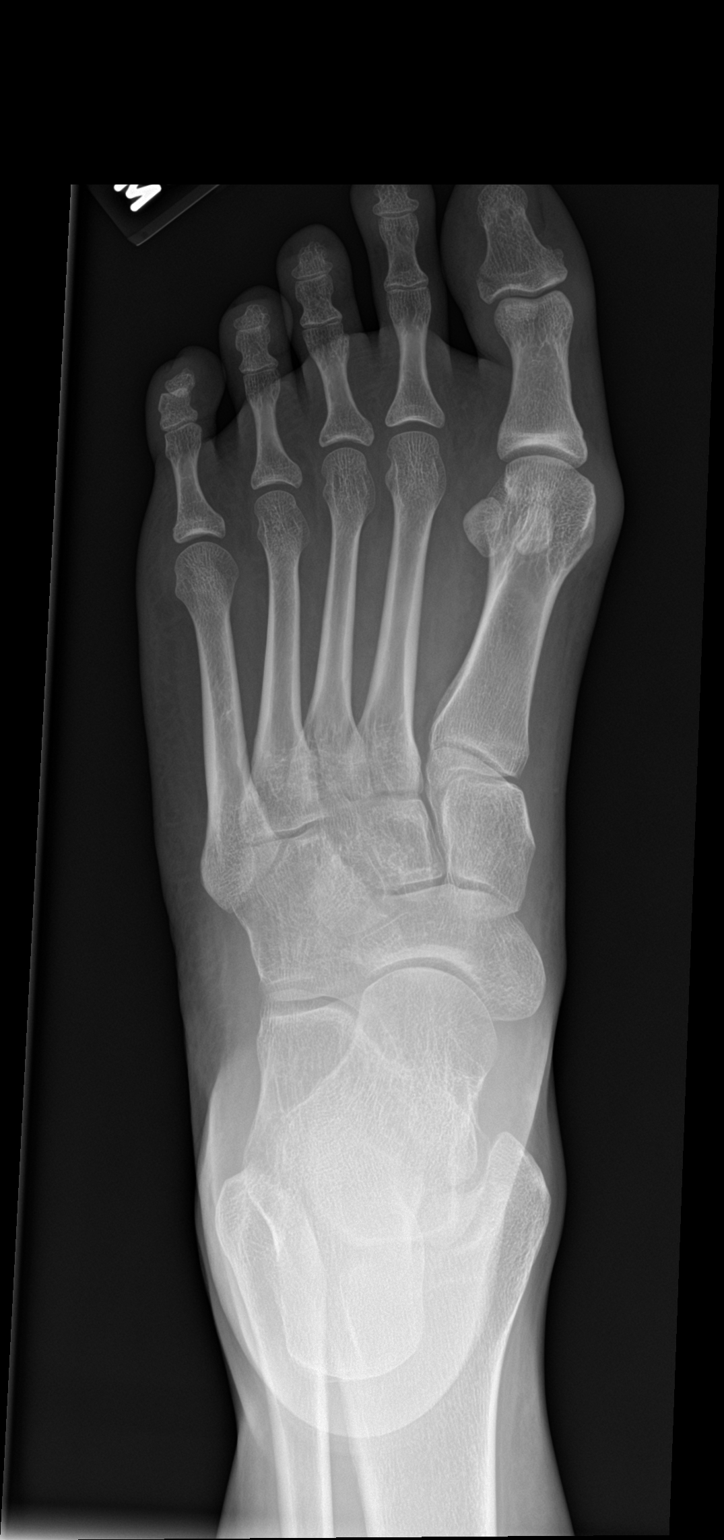

[foot obl]
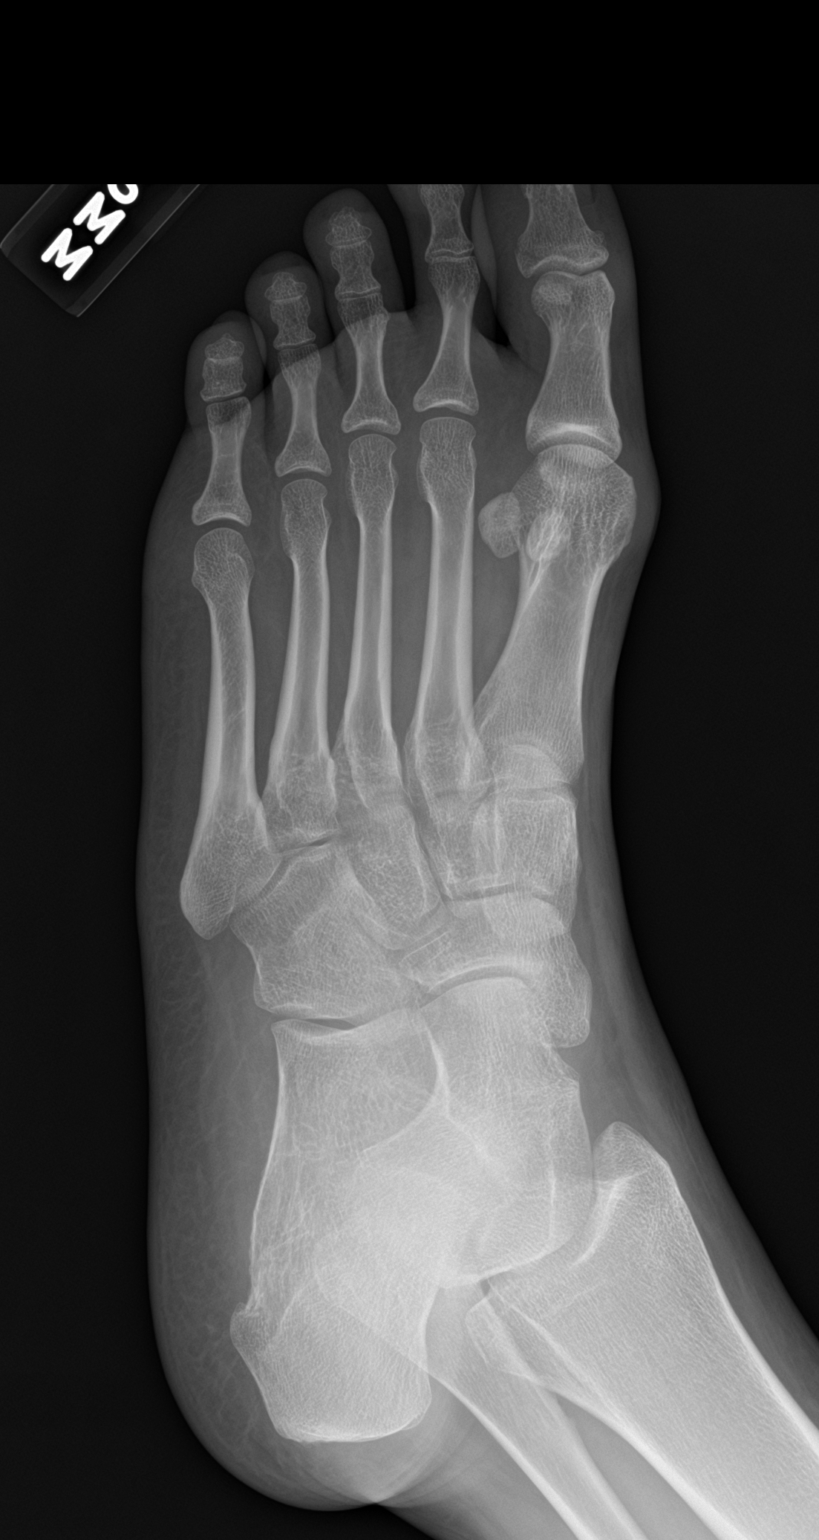

[foot lat]
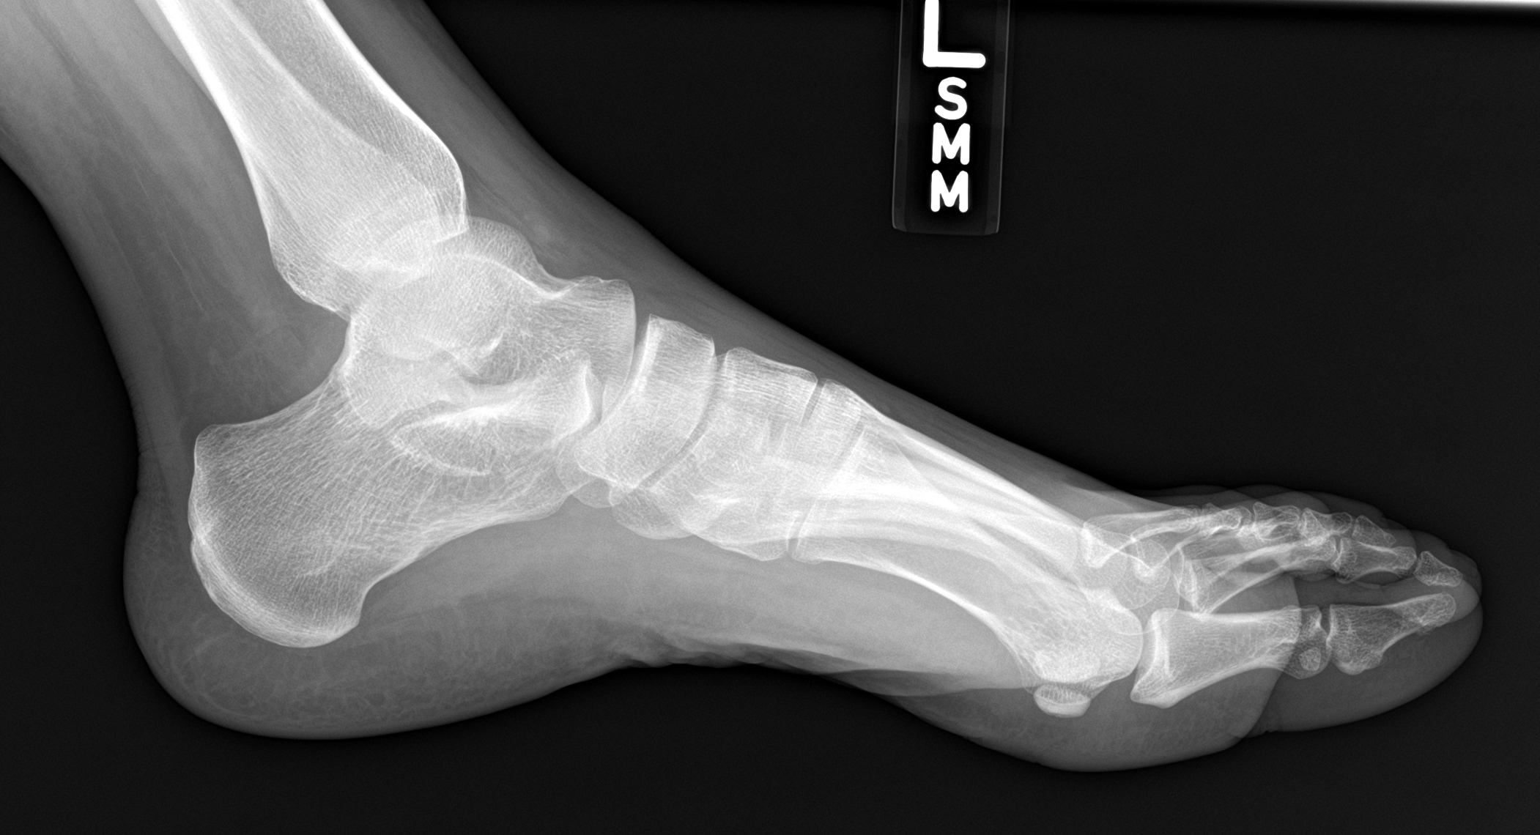

[3 of 3 positions shown; findings below may reference images not displayed]

FINDINGS: A nondisplaced fracture at the medial base of the great toe distal
phalanx extends to the interphalangeal joint.

No other fractures are noted.

No subluxation or dislocation identified.

No suspicious bony lesions are present.
IMPRESSION: Nondisplaced intra-articular fracture at the base of the great toe
distal phalanx.

## 2022-02-11 ENCOUNTER — Encounter (HOSPITAL_COMMUNITY): Payer: Self-pay | Admitting: Emergency Medicine

## 2022-02-11 ENCOUNTER — Ambulatory Visit (HOSPITAL_COMMUNITY)
Admission: EM | Admit: 2022-02-11 | Discharge: 2022-02-11 | Disposition: A | Discharge status: Home / Self Care | Payer: Medicaid Other | Attending: Emergency Medicine | Admitting: Emergency Medicine

## 2022-02-11 DIAGNOSIS — S00451A Superficial foreign body of right ear, initial encounter: Secondary | ICD-10-CM

## 2022-02-11 DIAGNOSIS — L089 Local infection of the skin and subcutaneous tissue, unspecified: Secondary | ICD-10-CM

## 2022-02-11 DIAGNOSIS — H9201 Otalgia, right ear: Secondary | ICD-10-CM

## 2022-02-11 MED ORDER — TRAMADOL HCL 50 MG PO TABS: 50.0000 mg | ORAL_TABLET | Freq: Four times a day (QID) | ORAL | 0 refills | Status: AC | PRN

## 2022-02-11 MED ORDER — DOXYCYCLINE HYCLATE 100 MG PO CAPS: 100.0000 mg | ORAL_CAPSULE | Freq: Two times a day (BID) | ORAL | 0 refills | Status: AC

## 2022-02-11 MED ORDER — TRAMADOL HCL 50 MG PO TABS: 50.0000 mg | ORAL_TABLET | Freq: Four times a day (QID) | ORAL | 0 refills | Status: DC | PRN

## 2022-02-11 NOTE — ED Provider Notes (Signed)
[facility]    CSN: [id number] Arrival date & time: 02/11/22  1314      History   Chief Complaint Chief Complaint  Patient presents with   Ear Problem   Facial Swelling    HPI Barbara Hodges is a 36 y.o. female.   Patient presents with right-sided ear pain beginning 1 day ago, right-sided facial swelling and pain in right ear drainage beginning this morning.  Endorses that pain has been severe and she is unable to touch the ear.  Piercing placed into the tragus 7 days ago.  Denies fever or chills, drainage.  Attempted to remove but unable to tolerate.   Past Medical History:  Diagnosis Date   Anxiety    Generalized headaches    JME (juvenile myoclonic epilepsy) (HCC)    Age of 68   Panic attack    Seizures (HCC)     Patient Active Problem List   Diagnosis Date Noted   Seizures (HCC) 12/28/2015   Seizure (HCC) 12/28/2015   H/O medication noncompliance 03/31/2014   JME (juvenile myoclonic epilepsy) (HCC) 03/04/2013    Past Surgical History:  Procedure Laterality Date   TUBAL LIGATION      OB History     Gravida  2   Para  1   Term  1   Preterm      AB  1   Living         SAB      IAB      Ectopic      Multiple      Live Births               Home Medications    Prior to Admission medications   Medication Sig Start Date End Date Taking? Authorizing Provider  HYDROcodone-acetaminophen (NORCO/VICODIN) 5-325 MG tablet Take 1-2 tablets by mouth every 6 (six) hours as needed for moderate pain or severe pain. 01/19/17   Sharlene Dory, DO  lamoTRIgine (LAMICTAL) 200 MG tablet Take 1 tablet (200 mg total) by mouth daily. 01/11/16   Anders Simmonds, PA-C  traMADol (ULTRAM) 50 MG tablet Take 1 tablet (50 mg total) by mouth every 6 (six) hours as needed. 09/21/16   Khatri, Hina, PA-C  zonisamide (ZONEGRAN) 100 MG capsule Take 800mg  daily (8 tabs) Patient taking differently: Take 800 mg by mouth daily. Take 800mg  daily (8 tabs)  01/11/16   McClung, , PA-C    Family History Family History  Problem Relation Age of Onset   Healthy Mother    Other Father    Asthma Brother    Healthy Son    Healthy Daughter     Social History Social History   Tobacco Use   Smoking status: Never   Smokeless tobacco: Never  Substance Use Topics   Alcohol use: Yes    Comment: occasionally   Drug use: Yes    Types: Marijuana    Comment: occasionally     Allergies   Chocolate and Naprosyn [naproxen]   Review of Systems Review of Systems  Constitutional: Negative.   HENT:  Positive for ear discharge and ear pain. Negative for congestion, dental problem, drooling, facial swelling, hearing loss, mouth sores, nosebleeds, postnasal drip, rhinorrhea, sinus pressure, sinus pain, sneezing, sore throat, tinnitus, trouble swallowing and voice change.   Respiratory: Negative.    Cardiovascular: Negative.   Skin: Negative.      Physical Exam Triage Vital Signs ED Triage Vitals  Enc Vitals Group  BP 02/11/22 1424 101/67     Pulse Rate 02/11/22 1424 (!) 58     Resp 02/11/22 1424 18     Temp 02/11/22 1424 97.9 F (36.6 C)     Temp Source 02/11/22 1424 Oral     SpO2 02/11/22 1424 100 %     Weight --      Height --      Head Circumference --      Peak Flow --      Pain Score 02/11/22 1422 10     Pain Loc --      Pain Edu? --      Excl. in GC? --    No data found.  Updated Vital Signs BP 101/67 (BP Location: Left Arm)   Pulse (!) 58   Temp 97.9 F (36.6 C) (Oral)   Resp 18   SpO2 100%   Visual Acuity Right Eye Distance:   Left Eye Distance:   Bilateral Distance:    Right Eye Near:   Left Eye Near:    Bilateral Near:     Physical Exam Constitutional:      Appearance: Normal appearance.  HENT:     Head: Normocephalic.     Ears:     Comments: Moderate to severe swelling, erythema and tenderness to the right tragus with piercing noted to the center Eyes:     Extraocular Movements:  Extraocular movements intact.  Pulmonary:     Effort: Pulmonary effort is normal.  Neurological:     Mental Status: She is alert and oriented to person, place, and time.      UC Treatments / Results  Labs (all labs ordered are listed, but only abnormal results are displayed) Labs Reviewed - No data to display  EKG   Radiology No results found.  Procedures Foreign Body Removal  Date/Time: 02/11/2022 4:10 PM  Performed by: Valinda Hoar, NP Authorized by: Valinda Hoar, NP   Consent:    Consent obtained:  Verbal   Consent given by:  Patient   Risks, benefits, and alternatives were discussed: yes     Risks discussed:  Bleeding, infection, pain and incomplete removal   Alternatives discussed:  Delayed treatment Universal protocol:    Procedure explained and questions answered to patient or proxy's satisfaction: yes     Patient identity confirmed:  Verbally with patient Location:    Location:  Ear   Ear location:  R ear   Tendon involvement:  None Pre-procedure details:    Imaging:  None   Neurovascular status: intact   Anesthesia:    Anesthesia method:  Local infiltration   Local anesthetic:  Lidocaine 1% w/o epi Procedure type:    Procedure complexity:  Simple Procedure details:    Incision length:  0.5   Localization method:  Visualized   Dissection of underlying tissues: no     Removal mechanism: tweezer.   Foreign bodies recovered:  1   Description:  Earring and backing   Intact foreign body removal: yes   Post-procedure details:    Neurovascular status: intact     Confirmation:  No additional foreign bodies on visualization   Skin closure:  None   Dressing:  Non-adherent dressing   Procedure completion:  Tolerated  (including critical care time)  Medications Ordered in UC Medications - No data to display  Initial Impression / Assessment and Plan / UC Course  I have reviewed the triage vital signs and the nursing notes.  Pertinent labs &  imaging results that were available during my care of the patient were reviewed by me and considered in my medical decision making (see chart for details).  Infected embedded earring, right ear pain  Foreign body able to be removed, tolerated well, prescribed doxycycline and tramadol for outpatient management, may cleanse daily with diluted soapy water and pat dry and cover with nonadherent dressing until skin is closed, advised to not attempt to repierce site for at least 6 to 8 weeks ,may follow-up with urgent care as needed for nonhealing site Final Clinical Impressions(s) / UC Diagnoses   Final diagnoses:  None   Discharge Instructions   None    ED Prescriptions   None    PDMP not reviewed this encounter.   Hans Eden, [place] 02/11/22 [phone]

## 2022-02-11 NOTE — Discharge Instructions (Addendum)
Today you are being treated for an infected earring that was able to be removed in its entirety during the exam today  Begin doxycycline every morning and every evening for 10 days to clear infection  May cleanse area daily during noted normal hygiene using diluted soapy water, pat dry and cover with a Band-Aid until area has closed  May hold warm compresses or ice to the ear into the 15-minute intervals help reduce swelling and for comfort  May gently clean the internal ear with Q-tip to remove blood  Use tramadol every 6 hours as needed for severe pain, attempt \\Tylenol  500 to 1000 mg every 6 hours first  Please wait at least 6 to 8 weeks before attempting to repair this site

## 2022-02-11 NOTE — ED Triage Notes (Signed)
Pt reports waking up the morning with right side facial swelling and pain. States she got an ear piercing 1 week ago and believes it is in infected. Does have pus drainage coming from ear piercing.

## 2022-06-20 ENCOUNTER — Other Ambulatory Visit: Payer: Self-pay

## 2022-06-20 ENCOUNTER — Emergency Department (HOSPITAL_COMMUNITY): Payer: Medicaid - Out of State

## 2022-06-20 ENCOUNTER — Emergency Department (HOSPITAL_COMMUNITY)
Admission: EM | Admit: 2022-06-20 | Discharge: 2022-06-20 | Disposition: A | Discharge status: Home / Self Care | Payer: Medicaid - Out of State | Attending: Emergency Medicine | Admitting: Emergency Medicine

## 2022-06-20 DIAGNOSIS — R1011 Right upper quadrant pain: Secondary | ICD-10-CM

## 2022-06-20 LAB — COMPREHENSIVE METABOLIC PANEL
ALT: 13 U/L (ref 0–44)
AST: 16 U/L (ref 15–41)
Albumin: 5 g/dL (ref 3.5–5.0)
Alkaline Phosphatase: 52 U/L (ref 38–126)
Anion gap: 7 (ref 5–15)
BUN: 7 mg/dL (ref 6–20)
CO2: 24 mmol/L (ref 22–32)
Calcium: 9.5 mg/dL (ref 8.9–10.3)
Chloride: 104 mmol/L (ref 98–111)
Creatinine, Ser: 0.74 mg/dL (ref 0.44–1.00)
GFR, Estimated: >60 mL/min (ref >60)
Glucose, Bld: 102 mg/dL — ABNORMAL HIGH (ref 70–99)
Potassium: 4 mmol/L (ref 3.5–5.1)
Sodium: 135 mmol/L (ref 135–145)
Total Bilirubin: 1 mg/dL (ref 0.3–1.2)
Total Protein: 8.7 g/dL — ABNORMAL HIGH (ref 6.5–8.1)

## 2022-06-20 LAB — CBC WITH DIFFERENTIAL/PLATELET
Abs Immature Granulocytes: 0.01 10*3/uL (ref 0.00–0.07)
Basophils Absolute: 0 10*3/uL (ref 0.0–0.1)
Basophils Relative: 0 %
Eosinophils Absolute: 0.1 10*3/uL (ref 0.0–0.5)
Eosinophils Relative: 1 %
HCT: 43.2 % (ref 36.0–46.0)
Hemoglobin: 14.7 g/dL (ref 12.0–15.0)
Immature Granulocytes: 0 %
Lymphocytes Relative: 35 %
Lymphs Abs: 1.9 10*3/uL (ref 0.7–4.0)
MCH: 31.3 pg (ref 26.0–34.0)
MCHC: 34 g/dL (ref 30.0–36.0)
MCV: 92.1 fL (ref 80.0–100.0)
Monocytes Absolute: 0.4 10*3/uL (ref 0.1–1.0)
Monocytes Relative: 7 %
Neutro Abs: 3 10*3/uL (ref 1.7–7.7)
Neutrophils Relative %: 57 %
Platelets: 419 10*3/uL — ABNORMAL HIGH (ref 150–400)
RBC: 4.69 MIL/uL (ref 3.87–5.11)
RDW: 12.7 % (ref 11.5–15.5)
WBC: 5.4 10*3/uL (ref 4.0–10.5)
nRBC: 0 % (ref 0.0–0.2)

## 2022-06-20 LAB — I-STAT BETA HCG BLOOD, ED (MC, WL, AP ONLY): I-stat hCG, quantitative: <5 m[IU]/mL (ref <5)

## 2022-06-20 LAB — URINALYSIS, ROUTINE W REFLEX MICROSCOPIC
Bilirubin Urine: NEGATIVE
Glucose, UA: NEGATIVE mg/dL
Hgb urine dipstick: NEGATIVE
Ketones, ur: NEGATIVE mg/dL
Leukocytes,Ua: NEGATIVE
Nitrite: NEGATIVE
Protein, ur: NEGATIVE mg/dL
Specific Gravity, Urine: 1.021 (ref 1.005–1.030)
pH: 6 (ref 5.0–8.0)

## 2022-06-20 MED ORDER — ONDANSETRON 4 MG PO TBDP: ORAL_TABLET | ORAL | 0 refills | Status: AC

## 2022-06-20 MED ORDER — PANTOPRAZOLE SODIUM 40 MG IV SOLR
40.0000 mg | Freq: Once | INTRAVENOUS | Status: AC
  Administered 2022-06-20: 40 mg via INTRAVENOUS
  Filled 2022-06-20: qty 10

## 2022-06-20 MED ORDER — OXYCODONE-ACETAMINOPHEN 5-325 MG PO TABS
1.0000 | ORAL_TABLET | Freq: Once | ORAL | Status: AC
  Administered 2022-06-20: 1 via ORAL
  Filled 2022-06-20: qty 1

## 2022-06-20 MED ORDER — IOHEXOL 300 MG/ML  SOLN
80.0000 mL | Freq: Once | INTRAMUSCULAR | Status: AC | PRN
  Administered 2022-06-20: 80 mL via INTRAVENOUS

## 2022-06-20 MED ORDER — ONDANSETRON HCL 4 MG/2ML IJ SOLN
4.0000 mg | Freq: Once | INTRAMUSCULAR | Status: AC
  Administered 2022-06-20: 4 mg via INTRAVENOUS
  Filled 2022-06-20: qty 2

## 2022-06-20 MED ORDER — PANTOPRAZOLE SODIUM 20 MG PO TBEC: 20.0000 mg | DELAYED_RELEASE_TABLET | Freq: Every day | ORAL | 0 refills | Status: AC

## 2022-06-20 MED ORDER — SODIUM CHLORIDE 0.9 % IV BOLUS
1000.0000 mL | Freq: Once | INTRAVENOUS | Status: AC
  Administered 2022-06-20: 1000 mL via INTRAVENOUS

## 2022-06-20 MED ORDER — HYDROMORPHONE HCL 1 MG/ML IJ SOLN
0.5000 mg | Freq: Once | INTRAMUSCULAR | Status: AC
  Administered 2022-06-20: 0.5 mg via INTRAVENOUS
  Filled 2022-06-20: qty 1

## 2022-06-20 MED ORDER — ONDANSETRON 8 MG PO TBDP
8.0000 mg | ORAL_TABLET | Freq: Once | ORAL | Status: AC
  Administered 2022-06-20: 8 mg via ORAL
  Filled 2022-06-20: qty 1

## 2022-06-20 MED ORDER — OXYCODONE-ACETAMINOPHEN 5-325 MG PO TABS: 1.0000 | ORAL_TABLET | Freq: Four times a day (QID) | ORAL | 0 refills | Status: DC | PRN

## 2022-06-20 NOTE — ED Notes (Signed)
Labeled specimen cup given to pt for U/A collection per MD order. ENMiles 

## 2022-06-20 NOTE — ED Provider Notes (Signed)
[facility] AT [facility] Provider Note   CSN: [id number] Arrival date & time: 06/20/22  1713     History  Chief Complaint  Patient presents with   Abdominal Pain    Barbara Hodges is a 37 y.o. female.  Patient complains of abdominal pain..  Patient has a history of seizures.  The history is provided by the patient and medical records. No language interpreter was used.  Abdominal Pain Pain location:  Epigastric Pain quality: aching   Pain radiates to:  Does not radiate Pain severity:  Moderate Timing:  Constant Progression:  Waxing and waning Chronicity:  New Context: not alcohol use   Relieved by:  Nothing Associated symptoms: no chest pain, no cough, no diarrhea, no fatigue and no hematuria        Home Medications Prior to Admission medications   Medication Sig Start Date End Date Taking? Authorizing Provider  ondansetron (ZOFRAN-ODT) 4 MG disintegrating tablet 4mg  ODT q4 hours prn nausea/vomit 06/20/22  Yes Milton Ferguson, MD  oxyCODONE-acetaminophen (PERCOCET) 5-325 MG tablet Take 1 tablet by mouth every 6 (six) hours as needed for moderate pain. 06/20/22  Yes Milton Ferguson, MD  pantoprazole (PROTONIX) 20 MG tablet Take 1 tablet (20 mg total) by mouth daily. 06/20/22  Yes Milton Ferguson, MD  doxycycline (VIBRAMYCIN) 100 MG capsule Take 1 capsule (100 mg total) by mouth 2 (two) times daily. 02/11/22   White, Leitha Schuller, NP  HYDROcodone-acetaminophen (NORCO/VICODIN) 5-325 MG tablet Take 1-2 tablets by mouth every 6 (six) hours as needed for moderate pain or severe pain. 01/19/17   Shelda Pal, DO  lamoTRIgine (LAMICTAL) 200 MG tablet Take 1 tablet (200 mg total) by mouth daily. 01/11/16   Argentina Donovan, PA-C  traMADol (ULTRAM) 50 MG tablet Take 1 tablet (50 mg total) by mouth every 6 (six) hours as needed. 02/11/22   White, Leitha Schuller, NP  zonisamide (ZONEGRAN) 100 MG capsule Take 800mg  daily (8 tabs) Patient taking differently:  Take 800 mg by mouth daily. Take 800mg  daily (8 tabs) 01/11/16   Argentina Donovan, PA-C      Allergies    Chocolate and Naprosyn [naproxen]    Review of Systems   Review of Systems  Constitutional:  Negative for appetite change and fatigue.  HENT:  Negative for congestion, ear discharge and sinus pressure.   Eyes:  Negative for discharge.  Respiratory:  Negative for cough.   Cardiovascular:  Negative for chest pain.  Gastrointestinal:  Positive for abdominal pain. Negative for diarrhea.  Genitourinary:  Negative for frequency and hematuria.  Musculoskeletal:  Negative for back pain.  Skin:  Negative for rash.  Neurological:  Negative for seizures and headaches.  Psychiatric/Behavioral:  Negative for hallucinations.     Physical Exam Updated Vital Signs BP 114/89   Pulse 63   Temp 98 F (36.7 C)   Resp 16   Ht 5\' 6"  (1.676 m)   Wt 70 kg   SpO2 100%   BMI 24.91 kg/m  Physical Exam Vitals and nursing note reviewed.  Constitutional:      Appearance: She is well-developed.  HENT:     Head: Normocephalic.     Nose: Nose normal.  Eyes:     General: No scleral icterus.    Conjunctiva/sclera: Conjunctivae normal.  Neck:     Thyroid: No thyromegaly.  Cardiovascular:     Rate and Rhythm: Normal rate and regular rhythm.     Heart sounds: No murmur  heard.    No friction rub. No gallop.  Pulmonary:     Breath sounds: No stridor. No wheezing or rales.  Chest:     Chest wall: No tenderness.  Abdominal:     General: There is no distension.     Tenderness: There is abdominal tenderness. There is no rebound.  Musculoskeletal:        General: Normal range of motion.     Cervical back: Neck supple.  Lymphadenopathy:     Cervical: No cervical adenopathy.  Skin:    Findings: No erythema or rash.  Neurological:     Mental Status: She is alert and oriented to person, place, and time.     Motor: No abnormal muscle tone.     Coordination: Coordination normal.  Psychiatric:         Behavior: Behavior normal.     ED Results / Procedures / Treatments   Labs (all labs ordered are listed, but only abnormal results are displayed) Labs Reviewed  CBC WITH DIFFERENTIAL/PLATELET - Abnormal; Notable for the following components:      Result Value   Platelets 419 (*)    All other components within normal limits  COMPREHENSIVE METABOLIC PANEL - Abnormal; Notable for the following components:   Glucose, Bld 102 (*)    Total Protein 8.7 (*)    All other components within normal limits  URINALYSIS, ROUTINE W REFLEX MICROSCOPIC - Abnormal; Notable for the following components:   APPearance HAZY (*)    All other components within normal limits  I-STAT BETA HCG BLOOD, ED ([facility], [facility], [facility] ONLY)    EKG None  Radiology US Abdomen Complete  Result Date: 06/20/2022 CLINICAL DATA:  Abdominal pain x3 days EXAM: ABDOMEN ULTRASOUND COMPLETE COMPARISON:  CT abdomen/pelvis dated 06/20/2022 FINDINGS: Gallbladder: 2.8 cm gallstone. No gallbladder wall thickening or pericholecystic fluid. Negative sonographic Murphy's sign. Common bile duct: Diameter: 3 mm Liver: No focal lesion identified. Within normal limits in parenchymal echogenicity. Portal vein is patent on color Doppler imaging with normal direction of blood flow towards the liver. IVC: No abnormality visualized. Pancreas: Visualized portion unremarkable. Spleen: Size and appearance within normal limits. Right Kidney: Length: 9.6 cm. Echogenicity within normal limits. No mass or hydronephrosis visualized. Left Kidney: Length: 9.8 cm. Echogenicity within normal limits. No mass or hydronephrosis visualized. Abdominal aorta: No aneurysm visualized. Other findings: None. IMPRESSION: Cholelithiasis, without associated sonographic findings to suggest acute cholecystitis. Electronically Signed   By: Charline Bills M.D.   On: 06/20/2022 19:56   CT ABDOMEN PELVIS W CONTRAST  Result Date: 06/20/2022 CLINICAL DATA:  Abdominal pain, acute. EXAM:  CT ABDOMEN AND PELVIS WITH CONTRAST TECHNIQUE: Multidetector CT imaging of the abdomen and pelvis was performed using the standard protocol following bolus administration of intravenous contrast. RADIATION DOSE REDUCTION: This exam was performed according to the departmental dose-optimization program which includes automated exposure control, adjustment of the mA and/or kV according to patient size and/or use of iterative reconstruction technique. CONTRAST:  19mL OMNIPAQUE IOHEXOL 300 MG/ML  SOLN COMPARISON:  None Available. FINDINGS: Lower chest: No acute abnormality. Hepatobiliary: No focal liver abnormality is seen. Large rim calcified gallstone measuring up to 1.7 cm without gallbladder wall thickening, or biliary dilatation. Pancreas: Unremarkable. No pancreatic ductal dilatation or surrounding inflammatory changes. Spleen: Normal in size without focal abnormality. Adrenals/Urinary Tract: Adrenal glands are unremarkable. Kidneys are normal, without renal calculi, focal lesion, or hydronephrosis. Bladder is unremarkable. Stomach/Bowel: Stomach is within normal limits. Appendix appears normal. No evidence of  bowel wall thickening, distention, or inflammatory changes. Vascular/Lymphatic: No significant vascular findings are present. No enlarged abdominal or pelvic lymph nodes. Reproductive: Retroverted uterus and bilateral adnexa are unremarkable. Other: No abdominal wall hernia or abnormality. No abdominopelvic ascites. Musculoskeletal: No acute or significant osseous findings. Partially imaged left femoral hardware IMPRESSION: 1. No CT evidence of acute abdominal/pelvic process. 2. Cholelithiasis without evidence of acute cholecystitis. Electronically Signed   By: Keane Police D.O.   On: 06/20/2022 18:47    Procedures Procedures    Medications Ordered in ED Medications  ondansetron (ZOFRAN-ODT) disintegrating tablet 8 mg (8 mg Oral Given 06/20/22 1729)  iohexol (OMNIPAQUE) 300 MG/ML solution 80 mL (80 mLs  Intravenous Contrast Given 06/20/22 1827)  sodium chloride 0.9 % bolus 1,000 mL (1,000 mLs Intravenous New Bag/Given 06/20/22 1938)  HYDROmorphone (DILAUDID) injection 0.5 mg (0.5 mg Intravenous Given 06/20/22 1934)  pantoprazole (PROTONIX) injection 40 mg (40 mg Intravenous Given 06/20/22 1933)  ondansetron (ZOFRAN) injection 4 mg (4 mg Intravenous Given 06/20/22 1933)    ED Course/ Medical Decision Making/ A&P                             Medical Decision Making Amount and/or Complexity of Data Reviewed Radiology: ordered.  Risk Prescription drug management.  This patient presents to the ED for concern of abdominal pain, this involves an extensive number of treatment options, and is a complaint that carries with it a high risk of complications and morbidity.  The differential diagnosis includes gallbladder disease, gastritis, appendicitis   Co morbidities that complicate the patient evaluation  Seizures   Additional history obtained:  Additional history obtained from patient hospital records External records from outside source obtained and reviewed including hospital records   Lab Tests:  I Ordered, and personally interpreted labs.  The pertinent results include: CBC and chemistries unremarkable   Imaging Studies ordered:  I ordered imaging studies including CT abdomen and ultrasound abdomen I independently visualized and interpreted imaging which showed gallstones no cholecystitis I agree with the radiologist interpretation   Cardiac Monitoring: / EKG:  The patient was maintained on a cardiac monitor.  I personally viewed and interpreted the cardiac monitored which showed an underlying rhythm of: Normal sinus rhythm   Consultations Obtained:  No consultant  Problem List / ED Course / Critical interventions / Medication management  Abdominal pain and seizures and gallstones I ordered medication including Dilaudid for pain Reevaluation of the patient after these  medicines showed that the patient improved I have reviewed the patients home medicines and have made adjustments as needed   Social Determinants of Health:  None   Test / Admission - Considered:  None  Patient with gallstones but no cholecystitis.  She is given pain medicine and nausea medicine along with Protonix and will follow-up with general surgery        Final Clinical Impression(s) / ED Diagnoses Final diagnoses:  Right upper quadrant abdominal pain    Rx / DC Orders ED Discharge Orders          Ordered    oxyCODONE-acetaminophen (PERCOCET) 5-325 MG tablet  Every 6 hours PRN        06/20/22 2152    ondansetron (ZOFRAN-ODT) 4 MG disintegrating tablet        06/20/22 2152    pantoprazole (PROTONIX) 20 MG tablet  Daily        06/20/22 2152  Milton Ferguson, MD 06/21/22 [phone]

## 2022-06-20 NOTE — Discharge Instructions (Signed)
Away from eating fatty or greasy foods.  Call make an appointment with [place]: Surgery for recheck of your abdominal pain from gallstones

## 2022-06-20 NOTE — ED Triage Notes (Signed)
Patient c/o abdominal pain x3 das. Patient report increase pain on her LUQ abdomen. Pt c/o nausea and emesis x 6 today. Patient report unable to eat or drink. Pt a/ox4.

## 2022-06-20 NOTE — ED Provider Triage Note (Signed)
Emergency Medicine Provider Triage Evaluation Note  Barbara Hodges , a 37 y.o. female  was evaluated in triage.  Pt complains of abdominal pain ongoing for the past 3 days.  Today is the worst.  Does not radiate anywhere.  Associated with nausea and vomiting.  Denies hematemesis.  Denies constipation or diarrhea.  Denies dysuria, or vaginal discharge.  Review of Systems  Positive: As above Negative: As above  Physical Exam  BP 114/89   Pulse 63   Temp 98 F (36.7 C)   Resp 16   Ht 5\' 6"  (1.676 m)   Wt 70 kg   SpO2 100%   BMI 24.91 kg/m  Gen:   Awake, no distress   Resp:  Normal effort  MSK:   Moves extremities without difficulty Other:  Mild tenderness to palpation present over epigastric region, and left upper quadrant.  Mild CVA tenderness on the left.  No distention  Medical Decision Making  Medically screening exam initiated at 5:28 PM.  Appropriate orders placed.  Barbara Hodges was informed that the remainder of the evaluation will be completed by another provider, this initial triage assessment does not replace that evaluation, and the importance of remaining in the ED until their evaluation is complete.     Evlyn Courier, PA-C 06/20/22 1729

## 2023-07-27 ENCOUNTER — Emergency Department (HOSPITAL_COMMUNITY)
Admission: EM | Admit: 2023-07-27 | Discharge: 2023-07-28 | Discharge status: Against Medical Advice | Payer: Self-pay | Attending: Emergency Medicine | Admitting: Emergency Medicine

## 2023-07-27 ENCOUNTER — Emergency Department (HOSPITAL_COMMUNITY): Payer: Self-pay

## 2023-07-27 ENCOUNTER — Other Ambulatory Visit: Payer: Self-pay

## 2023-07-27 ENCOUNTER — Encounter (HOSPITAL_COMMUNITY): Payer: Self-pay | Admitting: Emergency Medicine

## 2023-07-27 DIAGNOSIS — R079 Chest pain, unspecified: Secondary | ICD-10-CM | POA: Insufficient documentation

## 2023-07-27 DIAGNOSIS — M549 Dorsalgia, unspecified: Secondary | ICD-10-CM | POA: Insufficient documentation

## 2023-07-27 DIAGNOSIS — Z5321 Procedure and treatment not carried out due to patient leaving prior to being seen by health care provider: Secondary | ICD-10-CM | POA: Insufficient documentation

## 2023-07-27 LAB — HCG, SERUM, QUALITATIVE: Preg, Serum: NEGATIVE

## 2023-07-27 MED ORDER — ACETAMINOPHEN 325 MG PO TABS: 650.0000 mg | ORAL_TABLET | Freq: Once | ORAL | Status: DC
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
## 2023-07-28 NOTE — ED Notes (Signed)
 Pt not answering to call for vitals in the lobby

## 2024-03-01 ENCOUNTER — Ambulatory Visit (HOSPITAL_COMMUNITY)
Admission: EM | Admit: 2024-03-01 | Discharge: 2024-03-01 | Disposition: A | Discharge status: Home / Self Care | Payer: Self-pay

## 2024-03-01 ENCOUNTER — Encounter (HOSPITAL_COMMUNITY): Payer: Self-pay

## 2024-03-01 DIAGNOSIS — T148XXA Other injury of unspecified body region, initial encounter: Secondary | ICD-10-CM

## 2024-03-01 DIAGNOSIS — Z23 Encounter for immunization: Secondary | ICD-10-CM

## 2024-03-01 MED ORDER — IBUPROFEN 800 MG PO TABS
ORAL_TABLET | ORAL | Status: AC
  Filled 2024-03-01: qty 1

## 2024-03-01 MED ORDER — TETANUS-DIPHTH-ACELL PERTUSSIS 5-2-15.5 LF-MCG/0.5 IM SUSP
0.5000 mL | Freq: Once | INTRAMUSCULAR | Status: AC
  Administered 2024-03-01: 0.5 mL via INTRAMUSCULAR

## 2024-03-01 MED ORDER — IBUPROFEN 800 MG PO TABS: 800.0000 mg | ORAL_TABLET | Freq: Three times a day (TID) | ORAL | 0 refills | Status: AC | PRN

## 2024-03-01 MED ORDER — IBUPROFEN 800 MG PO TABS
800.0000 mg | ORAL_TABLET | Freq: Once | ORAL | Status: AC
  Administered 2024-03-01: 800 mg via ORAL

## 2024-03-01 MED ORDER — TETANUS-DIPHTH-ACELL PERTUSSIS 5-2-15.5 LF-MCG/0.5 IM SUSP
INTRAMUSCULAR | Status: AC
  Filled 2024-03-01: qty 0.5

## 2024-03-01 NOTE — Discharge Instructions (Signed)
 You have been diagnosed with an abrasion/laceration that requires wound care.  -Use you have the area clean and dry -Change your bandage at least daily, keep bandage clean and dry.  If bandages become soiled or wet they need to be changed soon as possible. -Will get wound daily, if you notice color discharge, foul odor, or pale/gray/black discoloration of skin, increased redness, pain, or swelling you need to seek attention immediately. -You may use ibuprofen and Tylenol as needed for pain control.

## 2024-03-01 NOTE — ED Triage Notes (Signed)
 Patient states she stepped on a 2 inch nail and pulled the nail out of her left foot yesterday. Patient has pain and swelling to the left foot.  Patient does not know when she had her  last Tetanus .  Patient states she has been taking Tylenol  for pain.
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
## 2024-05-12 ENCOUNTER — Encounter (HOSPITAL_COMMUNITY): Payer: Self-pay | Admitting: Emergency Medicine

## 2024-05-12 ENCOUNTER — Emergency Department (HOSPITAL_COMMUNITY)
Admission: EM | Admit: 2024-05-12 | Discharge: 2024-05-12 | Discharge status: Against Medical Advice | Payer: Self-pay | Attending: Emergency Medicine | Admitting: Emergency Medicine

## 2024-05-12 ENCOUNTER — Emergency Department (HOSPITAL_COMMUNITY): Payer: Self-pay

## 2024-05-12 ENCOUNTER — Emergency Department (HOSPITAL_COMMUNITY)
Admission: EM | Admit: 2024-05-12 | Discharge: 2024-05-12 | Disposition: A | Discharge status: Home / Self Care | Payer: Self-pay | Attending: Emergency Medicine | Admitting: Emergency Medicine

## 2024-05-12 ENCOUNTER — Other Ambulatory Visit: Payer: Self-pay

## 2024-05-12 DIAGNOSIS — Y9301 Activity, walking, marching and hiking: Secondary | ICD-10-CM | POA: Insufficient documentation

## 2024-05-12 DIAGNOSIS — Z5321 Procedure and treatment not carried out due to patient leaving prior to being seen by health care provider: Secondary | ICD-10-CM | POA: Insufficient documentation

## 2024-05-12 DIAGNOSIS — S92351A Displaced fracture of fifth metatarsal bone, right foot, initial encounter for closed fracture: Secondary | ICD-10-CM | POA: Insufficient documentation

## 2024-05-12 DIAGNOSIS — M25571 Pain in right ankle and joints of right foot: Secondary | ICD-10-CM | POA: Insufficient documentation

## 2024-05-12 DIAGNOSIS — X501XXA Overexertion from prolonged static or awkward postures, initial encounter: Secondary | ICD-10-CM | POA: Insufficient documentation

## 2024-05-12 MED ORDER — OXYCODONE-ACETAMINOPHEN 5-325 MG PO TABS: 1.0000 | ORAL_TABLET | Freq: Four times a day (QID) | ORAL | 0 refills | Status: AC | PRN

## 2024-05-12 MED ORDER — OXYCODONE-ACETAMINOPHEN 5-325 MG PO TABS
1.0000 | ORAL_TABLET | Freq: Once | ORAL | Status: AC
  Administered 2024-05-12: 1 via ORAL
  Filled 2024-05-12: qty 1

## 2024-05-12 MED ORDER — HYDROCODONE-ACETAMINOPHEN 5-325 MG PO TABS: 1.0000 | ORAL_TABLET | ORAL | 0 refills | Status: DC | PRN

## 2024-05-12 MED ORDER — HYDROCODONE-ACETAMINOPHEN 5-325 MG PO TABS
1.0000 | ORAL_TABLET | Freq: Once | ORAL | Status: DC
  Filled 2024-05-12: qty 1

## 2024-05-12 NOTE — Progress Notes (Signed)
 Orthopedic Tech Progress Note Patient Details:  IZAMAR LINDEN 1985-08-20 [id number]  Ortho Devices Type of Ortho Device: Short leg splint Ortho Device/Splint Location: posterior and stirrup Ortho Device/Splint Interventions: Ordered, Application, Adjustment   Post Interventions Patient Tolerated: Well Instructions Provided: Care of device, Poper ambulation with device  Adine MARLA Blush 05/12/2024, 5:26 PM

## 2024-05-12 NOTE — ED Provider Notes (Signed)
 " [place] EMERGENCY DEPARTMENT AT [facility] Provider Note   CSN: [id number] Arrival date & time: 05/12/24  1519     Patient presents with: Foot Injury   Barbara Hodges is a 38 y.o. female.  Patient is a 38 year old female with a history of seizures who presents to the ED for reevaluation of a broken bone to the right foot.  Patient notes she was walking last evening and twisted the ankle hitting the outside of the right foot.  She notes she was seen at the [facility], ED and told she had a fracture but left prior to the splint being done due to her long wait times.  Has not taken any medications for pain.  Denies any previously known fractures to the foot or ankle.  No further complaints.    Foot Injury      Prior to Admission medications  Medication Sig Start Date End Date Taking? Authorizing Provider  oxyCODONE -acetaminophen  (PERCOCET/ROXICET) 5-325 MG tablet Take 1 tablet by mouth every 6 (six) hours as needed for up to 10 doses for severe pain (pain score 7-10). 05/12/24  Yes Izaan Kingbird, Thersia RAMAN, PA-C  doxycycline  (VIBRAMYCIN ) 100 MG capsule Take 1 capsule (100 mg total) by mouth 2 (two) times daily. Patient not taking: Reported on 03/01/2024 02/11/22   Teresa Shelba SAUNDERS, NP  ibuprofen  (ADVIL ) 800 MG tablet Take 1 tablet (800 mg total) by mouth every 8 (eight) hours as needed for moderate pain (pain score 4-6). 03/01/24   Andra Corean BROCKS, PA-C  lamoTRIgine  (LAMICTAL ) 200 MG tablet Take 1 tablet (200 mg total) by mouth daily. 01/11/16   McClung, Angela M, PA-C  ondansetron  (ZOFRAN -ODT) 4 MG disintegrating tablet 4mg  ODT q4 hours prn nausea/vomit Patient not taking: Reported on 03/01/2024 06/20/22   Zammit, Joseph, MD  pantoprazole  (PROTONIX ) 20 MG tablet Take 1 tablet (20 mg total) by mouth daily. Patient not taking: Reported on 03/01/2024 06/20/22   Zammit, Joseph, MD  traMADol  (ULTRAM ) 50 MG tablet Take 1 tablet (50 mg total) by mouth every 6 (six) hours as  needed. Patient not taking: Reported on 03/01/2024 02/11/22   Teresa Shelba SAUNDERS, NP  zonisamide  (ZONEGRAN ) 100 MG capsule Take 800mg  daily (8 tabs) Patient taking differently: Take 800 mg by mouth daily. Take 800mg  daily (8 tabs) 01/11/16   Danton Jon HERO, PA-C    Allergies: Chocolate and Naprosyn  [naproxen ]    Review of Systems  Musculoskeletal:  Positive for arthralgias.  All other systems reviewed and are negative.   Updated Vital Signs BP (!) 129/92 (BP Location: Left Arm)   Pulse 63   Temp 98.5 F (36.9 C) (Oral)   Resp 19   LMP 04/19/2024 (Within Days)   SpO2 100%   Physical Exam Constitutional:      Appearance: Normal appearance.  HENT:     Head: Normocephalic and atraumatic.  Cardiovascular:     Pulses: Normal pulses.     Comments: PT and DP pulses 2+ on the right Musculoskeletal:     Comments: Tender to palpation at the base of the fifth metatarsal on the right foot and over the medial and lateral malleolus of the ankle.    Full range of motion of the ankle and toes.  No obvious deformities noted.  Skin:    General: Skin is warm and dry.  Neurological:     Mental Status: She is alert and oriented to person, place, and time.  Psychiatric:        Mood  and Affect: Mood normal.        Behavior: Behavior normal.     (all labs ordered are listed, but only abnormal results are displayed) Labs Reviewed - No data to display  EKG: None  Radiology: DG Ankle Right Port Result Date: 05/12/2024 CLINICAL DATA:  Foot and ankle pain after fall. Rolled foot and ankle. Swelling. EXAM: PORTABLE RIGHT ANKLE - 2 VIEW; RIGHT FOOT COMPLETE - 3+ VIEW COMPARISON:  None Available. FINDINGS: Foot: Mildly displaced fracture involving the base of the fifth metatarsal. Fracture extends to the tarsal metatarsal joint. No additional fracture of the foot. The alignment is preserved. The joint spaces are normal. Soft tissue edema is seen at the fracture site. Ankle: No additional fracture  of the ankle. The alignment and ankle mortise are preserved. No ankle joint effusion. No focal soft tissue abnormality. IMPRESSION: Mildly displaced fracture involving the base of the fifth metatarsal. Electronically Signed   By: Andrea Gasman M.D.   On: 05/12/2024 02:00   DG Foot Complete Right Result Date: 05/12/2024 CLINICAL DATA:  Foot and ankle pain after fall. Rolled foot and ankle. Swelling. EXAM: PORTABLE RIGHT ANKLE - 2 VIEW; RIGHT FOOT COMPLETE - 3+ VIEW COMPARISON:  None Available. FINDINGS: Foot: Mildly displaced fracture involving the base of the fifth metatarsal. Fracture extends to the tarsal metatarsal joint. No additional fracture of the foot. The alignment is preserved. The joint spaces are normal. Soft tissue edema is seen at the fracture site. Ankle: No additional fracture of the ankle. The alignment and ankle mortise are preserved. No ankle joint effusion. No focal soft tissue abnormality. IMPRESSION: Mildly displaced fracture involving the base of the fifth metatarsal. Electronically Signed   By: Andrea Gasman M.D.   On: 05/12/2024 02:00     Medications Ordered in the ED  oxyCODONE -acetaminophen  (PERCOCET/ROXICET) 5-325 MG per tablet 1 tablet (1 tablet Oral Given 05/12/24 1658)                                   Medical Decision Making Patient is a 38 year old female who presents to the ED for right foot and ankle pain after a fall last evening.  Of note patient was seen at the [facility] emergency room but left prior to obtaining splint.  States she is needing a cast placed on the foot.  On exam patient is alert and in no acute distress.  Physical exam as noted above.  Neurovascular intact.  X-ray imaging from last night reviewed that does show a mildly displaced fracture involving the base of the fifth metatarsal but otherwise no acute fractures noted in the ankle.  The fracture is very minimally displaced and will not require reduction at this time.  Patient placed in a  posterior short leg splint and crutches provided.  Otherwise vital signs stable and stable for discharge home.  Patient given Percocet while in ED for pain and prescribed same.  Symptomatic care discussed.  Resources for orthopedic follow-up provided for further evaluation and management of fracture.  Advised to call to schedule follow-up appointment.  She verbalizes understanding of plan and is agreeable.  Return precautions provided.  Risk Prescription drug management.       Final diagnoses:  Closed displaced fracture of fifth metatarsal bone of right foot, initial encounter    ED Discharge Orders          Ordered    HYDROcodone -acetaminophen  (NORCO/VICODIN) 5-325 MG  tablet  Every 4 hours PRN,   Status:  Discontinued        05/12/24 1645    oxyCODONE -acetaminophen  (PERCOCET/ROXICET) 5-325 MG tablet  Every 6 hours PRN        05/12/24 1655               Neysa Thersia RAMAN, PA-C 05/12/24 1734  "

## 2024-05-12 NOTE — ED Notes (Signed)
Pt left due to wait time  

## 2024-05-12 NOTE — Discharge Instructions (Addendum)
 Keep splint clean and dry.  Use crutches to keep foot nonweightbearing.  Elevate foot on pillows at nighttime to help with swelling.  May take Percocet every 4-6 hours as needed for pain.  Please call orthopedics to schedule follow-up appointment for further evaluation and management of fracture.  Return to ED if any symptoms worsen including severe uncontrollable pain, severe discoloration to the foot/leg.

## 2024-05-12 NOTE — ED Triage Notes (Signed)
 Pt reports seen as [facility] for right foot pain last night, had a displaced frac, pt reports ED did put it back in place but she left before receiving a cast because she was placed back in lobby, pt here to finish treatment

## 2024-05-12 NOTE — ED Provider Triage Note (Signed)
 Emergency Medicine Provider Triage Evaluation Note  Barbara Hodges , a 38 y.o. female  was evaluated in triage.  Pt complains of right foot/ankle pain, walking and rolled foot/ankle, unable to bear weight. No other injuries.   Review of Systems  Positive:  Negative:   Physical Exam  BP 124/81 (BP Location: Left Arm)   Pulse 96   Temp 98.1 F (36.7 C) (Oral)   Resp 17   Ht 5' 6 (1.676 m)   Wt 93 kg   LMP 04/19/2024 (Within Days)   SpO2 100%   BMI 33.09 kg/m  Gen:   Awake, no distress   Resp:  Normal effort  MSK:   Moves extremities without difficulty  Other:  DP pulse present, sensation intact, skin intact. TTP 5th metatarsal and lateral ankle. No pain at knee  Medical Decision Making  Medically screening exam initiated at 1:24 AM.  Appropriate orders placed.  Barbara Hodges was informed that the remainder of the evaluation will be completed by another provider, this initial triage assessment does not replace that evaluation, and the importance of remaining in the ED until their evaluation is complete.     Beverley Leita LABOR, PA-C 05/12/24 0125

## 2024-05-12 NOTE — ED Triage Notes (Signed)
 BIB [facility] from home, pt had a misstep and rolled rt foot/ankle just PTA. NV check WNL, pt has swelling to rt foot/ankle.   BP 150/97 HR 68 Spo2 99% RA CBG 127 18 g RAC 200 mcg Fentanyl

## 2024-05-12 NOTE — ED Notes (Signed)
 Ortho tech called for pt splint to be done and pt walked out of the ED without been seen by ortho tech.
